# Patient Record
Sex: Female | Born: 1995 | Hispanic: Yes | Marital: Single | Smoking: Never smoker
Health system: Southern US, Community
[De-identification: ages and names within clinical notes are randomized; demographics above are authoritative.]

## PROBLEM LIST (undated history)

## (undated) DIAGNOSIS — Z789 Other specified health status: Secondary | ICD-10-CM

## (undated) HISTORY — DX: Other specified health status: Z78.9

## (undated) HISTORY — PX: NO PAST SURGERIES: SHX2092

---
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
## 2021-12-26 DIAGNOSIS — Z3400 Encounter for supervision of normal first pregnancy, unspecified trimester: Secondary | ICD-10-CM | POA: Insufficient documentation

## 2022-01-03 ENCOUNTER — Other Ambulatory Visit: Payer: Self-pay | Admitting: Obstetrics & Gynecology

## 2022-01-03 DIAGNOSIS — Z3682 Encounter for antenatal screening for nuchal translucency: Secondary | ICD-10-CM

## 2022-01-04 ENCOUNTER — Other Ambulatory Visit: Payer: Medicaid Other

## 2022-01-04 ENCOUNTER — Ambulatory Visit (INDEPENDENT_AMBULATORY_CARE_PROVIDER_SITE_OTHER): Payer: Medicaid Other

## 2022-01-04 ENCOUNTER — Other Ambulatory Visit: Payer: Self-pay | Admitting: Obstetrics & Gynecology

## 2022-01-04 ENCOUNTER — Encounter: Payer: Self-pay | Admitting: Obstetrics & Gynecology

## 2022-01-04 DIAGNOSIS — O3680X0 Pregnancy with inconclusive fetal viability, not applicable or unspecified: Secondary | ICD-10-CM | POA: Diagnosis not present

## 2022-01-04 DIAGNOSIS — Z3682 Encounter for antenatal screening for nuchal translucency: Secondary | ICD-10-CM

## 2022-01-04 NOTE — Progress Notes (Signed)
[place] 10 wks,single IUP,FHR 166 bpm,CRL 30.62 mm,normal ovaries

## 2022-01-05 ENCOUNTER — Ambulatory Visit: Payer: Medicaid Other | Admitting: *Deleted

## 2022-01-05 ENCOUNTER — Encounter: Payer: Self-pay | Admitting: Obstetrics & Gynecology

## 2022-01-05 DIAGNOSIS — Z3401 Encounter for supervision of normal first pregnancy, first trimester: Secondary | ICD-10-CM

## 2022-01-16 ENCOUNTER — Telehealth: Payer: Self-pay | Admitting: Obstetrics & Gynecology

## 2022-01-16 NOTE — Telephone Encounter (Signed)
Called patient and heard message that she has a vm that has not been set up.

## 2022-01-16 NOTE — Telephone Encounter (Signed)
Pt wants advice on taking tylenol.

## 2022-01-25 ENCOUNTER — Other Ambulatory Visit: Payer: Medicaid Other

## 2022-01-26 ENCOUNTER — Other Ambulatory Visit: Payer: Self-pay | Admitting: Obstetrics & Gynecology

## 2022-01-26 DIAGNOSIS — Z3682 Encounter for antenatal screening for nuchal translucency: Secondary | ICD-10-CM

## 2022-01-27 ENCOUNTER — Ambulatory Visit: Payer: Medicaid Other | Admitting: *Deleted

## 2022-01-27 ENCOUNTER — Ambulatory Visit (INDEPENDENT_AMBULATORY_CARE_PROVIDER_SITE_OTHER): Payer: Medicaid Other | Admitting: Obstetrics & Gynecology

## 2022-01-27 ENCOUNTER — Encounter: Payer: Self-pay | Admitting: Obstetrics & Gynecology

## 2022-01-27 ENCOUNTER — Other Ambulatory Visit (HOSPITAL_COMMUNITY)
Admission: RE | Admit: 2022-01-27 | Discharge: 2022-01-27 | Disposition: A | Discharge status: Home / Self Care | Payer: Medicaid Other | Source: Ambulatory Visit | Attending: Obstetrics & Gynecology | Admitting: Obstetrics & Gynecology

## 2022-01-27 ENCOUNTER — Ambulatory Visit (INDEPENDENT_AMBULATORY_CARE_PROVIDER_SITE_OTHER): Payer: Medicaid Other

## 2022-01-27 VITALS — BP 106/68 | HR 70 | Wt 180.0 lb

## 2022-01-27 DIAGNOSIS — Z3401 Encounter for supervision of normal first pregnancy, first trimester: Secondary | ICD-10-CM | POA: Diagnosis not present

## 2022-01-27 DIAGNOSIS — Z124 Encounter for screening for malignant neoplasm of cervix: Secondary | ICD-10-CM

## 2022-01-27 DIAGNOSIS — Z3A13 13 weeks gestation of pregnancy: Secondary | ICD-10-CM | POA: Diagnosis not present

## 2022-01-27 DIAGNOSIS — Z3682 Encounter for antenatal screening for nuchal translucency: Secondary | ICD-10-CM

## 2022-01-27 DIAGNOSIS — Z6831 Body mass index (BMI) 31.0-31.9, adult: Secondary | ICD-10-CM

## 2022-01-27 DIAGNOSIS — O219 Vomiting of pregnancy, unspecified: Secondary | ICD-10-CM | POA: Diagnosis not present

## 2022-01-27 LAB — POCT URINALYSIS DIPSTICK OB
Blood, UA: NEGATIVE
Glucose, UA: NEGATIVE
Leukocytes, UA: NEGATIVE
Nitrite, UA: NEGATIVE

## 2022-01-27 MED ORDER — DOXYLAMINE-PYRIDOXINE 10-10 MG PO TBEC: 2.0000 | DELAYED_RELEASE_TABLET | Freq: Every evening | ORAL | 2 refills | Status: AC

## 2022-01-27 MED ORDER — ASPIRIN 81 MG PO TBEC: 81.0000 mg | DELAYED_RELEASE_TABLET | Freq: Every day | ORAL | 3 refills | Status: AC

## 2022-01-27 MED ORDER — BLOOD PRESSURE MONITOR MISC: 0 refills | Status: DC

## 2022-01-27 NOTE — Progress Notes (Signed)
INITIAL OBSTETRICAL VISIT Patient name: Kelsey Cross MRN [id number]  Date of birth: 1995-07-20 Chief Complaint:   Initial Prenatal Visit (nausea)  History of Present Illness:   Kelsey Cross is a 26 y.o. G1P0 Hispanic female at [redacted]w[redacted]d by [place] at 10 weeks with an Estimated Date of Delivery: 08/02/22 being seen today for her initial obstetrical visit.   Her obstetrical history is significant for  uncomplicated, nullip .   Today she reports nausea.     01/27/2022   11:44 AM 04/03/2016   12:25 PM 04/03/2016   12:19 PM  Depression screen PHQ 2/9  Decreased Interest 0 0 0  Down, Depressed, Hopeless 0 0 0  PHQ - 2 Score 0 0 0  Altered sleeping 0 0   Tired, decreased energy 1 0   Change in appetite 1 0   Feeling bad or failure about yourself  0 0   Trouble concentrating 0 0   Moving slowly or fidgety/restless 0 0   Suicidal thoughts 0 0   PHQ-9 Score 2 0     Patient's last menstrual period was 10/01/2021. Last pap not sure outside facility. Results were: negative per pt report at [facility] Review of Systems:   Pertinent items are noted in HPI Denies cramping/contractions, leakage of fluid, vaginal bleeding, abnormal vaginal discharge w/ itching/odor/irritation, headaches, visual changes, shortness of breath, chest pain, abdominal pain, severe nausea/vomiting, or problems with urination or bowel movements unless otherwise stated above.  Pertinent History Reviewed:  Reviewed past medical,surgical, social, obstetrical and family history.  Reviewed problem list, medications and allergies. OB History  Gravida Para Term Preterm AB Living  1            SAB IAB Ectopic Multiple Live Births               # Outcome Date GA Lbr Len/2nd Weight Sex Delivery Anes PTL Lv  1 Current            Physical Assessment:   Vitals:   01/27/22 1131  BP: 106/68  Pulse: 70  Weight: 180 lb (81.6 kg)  Body mass index is 31.39 kg/m.       Physical Examination:  General appearance - well appearing, and  in no distress  Mental status - alert, oriented to person, place, and time  Psych:  She has a normal mood and affect  Skin - warm and dry, normal color, no suspicious lesions noted  Chest - effort normal, all lung fields clear to auscultation bilaterally  Heart - normal rate and regular rhythm  Abdomen - soft, nontender  Extremities:  No swelling or varicosities noted  Pelvic - VULVA: normal appearing vulva with no masses, tenderness or lesions  VAGINA: normal appearing vagina with normal color and discharge, no lesions  CERVIX: normal appearing cervix without discharge or lesions, no CMT  Thin prep pap is done with HR HPV cotesting  Chaperone: Latisha Cresenzo    TODAY'S NT - [place] 13+2 wks,measurements c/w dates,CRL 70.45 mm,NT 1.6 mm,NB present,FHR 166 bpm,posterior placenta,normal ovaries   Results for orders placed or performed in visit on 01/27/22 (from the past 24 hour(s))  POC Urinalysis Dipstick OB   Collection Time: 01/27/22 11:53 AM  Result Value Ref Range   Color, UA     Clarity, UA     Glucose, UA Negative Negative   Bilirubin, UA     Ketones, UA large    Spec Grav, UA     Blood, UA neg  pH, UA     POC,PROTEIN,UA Small (1+) Negative, Trace, Small (1+), Moderate (2+), Large (3+), 4+   Urobilinogen, UA     Nitrite, UA neg    Leukocytes, UA Negative Negative   Appearance     Odor      Assessment & Plan:  1) Low-Risk Pregnancy G1P0 at [redacted]w[redacted]d with an Estimated Date of Delivery: 08/02/22   2) Initial OB visit Start ASA daily  3) Nausea- Rx for Diclegis sent in   Meds:  Meds ordered this encounter  Medications   Blood Pressure Monitor MISC    Sig: For regular home bp monitoring during pregnancy    Dispense:  1 each    Refill:  0    Z34.81 Please mail to patient   Doxylamine-Pyridoxine (DICLEGIS) 10-10 MG TBEC    Sig: Take 2 tablets by mouth at bedtime.    Dispense:  60 tablet    Refill:  2   aspirin EC 81 MG tablet    Sig: Take 1 tablet (81 mg total) by  mouth daily. Swallow whole.    Dispense:  90 tablet    Refill:  3    Initial labs obtained Continue prenatal vitamins Reviewed n/v relief measures and warning s/s to report Reviewed recommended weight gain based on pre-gravid BMI Encouraged well-balanced diet Genetic & carrier screening discussed: requests Panorama,  Ultrasound discussed; fetal survey: results reviewed CCNC completed> form faxed if has or is planning to apply for medicaid The nature of [place] [facility] [employer] with multiple MDs and other Advanced Practice Providers was explained to patient; also emphasized that fellows, residents, and students are part of our team. She does not have a home bp cuff. Rx prescription given to patient. Check bp weekly, let us know if >140/90.   Indications for ASA therapy (per uptodate) One of the following: H/O preeclampsia, especially early onset/adverse outcome No Multifetal gestation No CHTN No T1DM or T2DM No Chronic kidney disease No Autoimmune disease (antiphospholipid syndrome, systemic lupus erythematosus) No  OR Two or more of the following: Nulliparity Yes Obesity (BMI>30 kg/m2) Yes Family h/o preeclampsia in mother or sister No Age ?35 years No Sociodemographic characteristics (African American race, low socioeconomic level) No Personal risk factors (eg, previous pregnancy w/ LBW or SGA, previous adverse pregnancy outcome [eg, stillbirth], interval >10 years between pregnancies) No  Indications for early A1C (per uptodate) BMI >=25 (>=23 in Asian women) AND one of the following GDM in a previous pregnancy No Previous A1C?5.7, impaired glucose tolerance, or impaired fasting glucose on previous testing No First-degree relative with diabetes No High-risk race/ethnicity (eg, African American, Latino, Native American, Asian American, Pacific Islander) Yes- Hispanic History of cardiovascular disease No HTN or on therapy for hypertension No HDL cholesterol  level <35 mg/dL (3.15 mmol/L) and/or a triglyceride level >250 mg/dL (1.76 mmol/L) No PCOS No Physical inactivity Yes Other clinical condition associated with insulin resistance (eg, severe obesity, acanthosis nigricans) No Previous birth of an infant weighing ?4000 g No Previous stillbirth of unknown cause No >= 40yo No  Follow-up: Return in about 4 weeks (around 02/24/2022) for LROB visit.   Orders Placed This Encounter  Procedures   Urine Culture   GC/Chlamydia Probe Amp   Integrated 1   HORIZON CUSTOM   CBC/D/Plt+RPR+Rh+ABO+RubIgG...   Hemoglobin A1c   Panorama Prenatal Test Full Panel   POC Urinalysis Dipstick OB   Myna Hidalgo, DO Attending Obstetrician & Gynecologist, [facility] for [employer], [place]  [facility]

## 2022-01-27 NOTE — Progress Notes (Signed)
[place] 13+2 wks,measurements c/w dates,CRL 70.45 mm,NT 1.6 mm,NB present,FHR 166 bpm,posterior placenta,normal ovaries

## 2022-01-29 LAB — URINE CULTURE

## 2022-01-31 ENCOUNTER — Telehealth: Payer: Self-pay | Admitting: Obstetrics & Gynecology

## 2022-01-31 LAB — INTEGRATED 1
Crown Rump Length: 70.5 mm
Gest. Age on Collection Date: 13 weeks
Maternal Age at EDD: 24.5 yr
Nuchal Translucency (NT): 1.6 mm
Number of Fetuses: 1
PAPP-A Value: 238 ng/mL
Weight: 180 [lb_av]

## 2022-01-31 LAB — CBC/D/PLT+RPR+RH+ABO+RUBIGG...
Antibody Screen: NEGATIVE
Basophils Absolute: 0 10*3/uL (ref 0.0–0.2)
Basos: 0 %
EOS (ABSOLUTE): 0 10*3/uL (ref 0.0–0.4)
Eos: 0 %
HCV Ab: NONREACTIVE
HIV Screen 4th Generation wRfx: NONREACTIVE
Hematocrit: 41.4 % (ref 34.0–46.6)
Hemoglobin: 13.6 g/dL (ref 11.1–15.9)
Hepatitis B Surface Ag: NEGATIVE
Immature Grans (Abs): 0 10*3/uL (ref 0.0–0.1)
Immature Granulocytes: 0 %
Lymphocytes Absolute: 1.7 10*3/uL (ref 0.7–3.1)
Lymphs: 24 %
MCH: 27.5 pg (ref 26.6–33.0)
MCHC: 32.9 g/dL (ref 31.5–35.7)
MCV: 84 fL (ref 79–97)
Monocytes Absolute: 0.4 10*3/uL (ref 0.1–0.9)
Monocytes: 6 %
Neutrophils Absolute: 5 10*3/uL (ref 1.4–7.0)
Neutrophils: 70 %
Platelets: 366 10*3/uL (ref 150–450)
RBC: 4.94 x10E6/uL (ref 3.77–5.28)
RDW: 13.1 % (ref 11.7–15.4)
RPR Ser Ql: NONREACTIVE
Rh Factor: POSITIVE
Rubella Antibodies, IGG: 0.95 index — ABNORMAL LOW (ref >0.99)
WBC: 7.2 10*3/uL (ref 3.4–10.8)

## 2022-01-31 LAB — HEMOGLOBIN A1C
Est. average glucose Bld gHb Est-mCnc: 108 mg/dL
Hgb A1c MFr Bld: 5.4 % (ref 4.8–5.6)

## 2022-01-31 LAB — HCV INTERPRETATION

## 2022-02-01 ENCOUNTER — Other Ambulatory Visit: Payer: Self-pay | Admitting: Obstetrics & Gynecology

## 2022-02-01 ENCOUNTER — Encounter: Payer: Self-pay | Admitting: *Deleted

## 2022-02-01 DIAGNOSIS — A749 Chlamydial infection, unspecified: Secondary | ICD-10-CM

## 2022-02-01 LAB — CYTOLOGY - PAP
Comment: NEGATIVE
Diagnosis: UNDETERMINED — AB
High risk HPV: NEGATIVE

## 2022-02-01 LAB — GC/CHLAMYDIA PROBE AMP
Chlamydia trachomatis, NAA: POSITIVE — AB
Neisseria Gonorrhoeae by PCR: NEGATIVE

## 2022-02-01 MED ORDER — AZITHROMYCIN 500 MG PO TABS: 1000.0000 mg | ORAL_TABLET | Freq: Every day | ORAL | 1 refills | Status: AC

## 2022-02-01 NOTE — Progress Notes (Signed)
Rx for Chlamydia sent in along with refill for partner if needed  Myna Hidalgo, DO Attending Obstetrician & Gynecologist, [facility] for [employer], [facility]

## 2022-02-04 LAB — PANORAMA PRENATAL TEST FULL PANEL:PANORAMA TEST PLUS 5 ADDITIONAL MICRODELETIONS
FETAL FRACTION: 2.2
REPORT SUMMARY: HIGH — AB
TRIPLOIDY 13 18 RESULT TEXT: HIGH — AB

## 2022-02-06 NOTE — Telephone Encounter (Signed)
Na

## 2022-02-14 LAB — HORIZON CUSTOM: REPORT SUMMARY: NEGATIVE

## 2022-02-24 ENCOUNTER — Encounter: Payer: Self-pay | Admitting: Obstetrics & Gynecology

## 2022-02-24 ENCOUNTER — Other Ambulatory Visit (HOSPITAL_COMMUNITY)
Admission: RE | Admit: 2022-02-24 | Discharge: 2022-02-24 | Disposition: A | Discharge status: Home / Self Care | Payer: Medicaid Other | Source: Ambulatory Visit | Attending: Obstetrics & Gynecology | Admitting: Obstetrics & Gynecology

## 2022-02-24 ENCOUNTER — Ambulatory Visit (INDEPENDENT_AMBULATORY_CARE_PROVIDER_SITE_OTHER): Payer: Medicaid Other | Admitting: Obstetrics & Gynecology

## 2022-02-24 VITALS — BP 131/73 | HR 77 | Wt 192.0 lb

## 2022-02-24 DIAGNOSIS — Z3402 Encounter for supervision of normal first pregnancy, second trimester: Secondary | ICD-10-CM

## 2022-02-24 DIAGNOSIS — N898 Other specified noninflammatory disorders of vagina: Secondary | ICD-10-CM | POA: Diagnosis present

## 2022-02-24 DIAGNOSIS — Z8619 Personal history of other infectious and parasitic diseases: Secondary | ICD-10-CM

## 2022-02-24 DIAGNOSIS — R3989 Other symptoms and signs involving the genitourinary system: Secondary | ICD-10-CM

## 2022-02-24 MED ORDER — AZITHROMYCIN 500 MG PO TABS: 1000.0000 mg | ORAL_TABLET | Freq: Once | ORAL | 1 refills | Status: AC

## 2022-02-24 NOTE — Progress Notes (Signed)
LOW-RISK PREGNANCY VISIT Patient name: Kelsey Cross MRN [id number]  Date of birth: [birth year] Chief Complaint:   Routine Prenatal Visit  History of Present Illness:   Kelsey Cross is a 26 y.o. G1P0 female at [redacted]w[redacted]d with an Estimated Date of Delivery: 08/02/22 being seen today for ongoing management of a low-risk pregnancy.   Notes discharge and irritation- no itching, no odor for the past 3-4 days.  Bought Monistat 7, tried it x 1.  No pelvic or abdominal pain.  Notes some discomfort with urination and some urinary frequency.  No hematuria.     01/27/2022   11:44 AM 04/03/2016   12:25 PM 04/03/2016   12:19 PM  Depression screen PHQ 2/9  Decreased Interest 0 0 0  Down, Depressed, Hopeless 0 0 0  PHQ - 2 Score 0 0 0  Altered sleeping 0 0   Tired, decreased energy 1 0   Change in appetite 1 0   Feeling bad or failure about yourself  0 0   Trouble concentrating 0 0   Moving slowly or fidgety/restless 0 0   Suicidal thoughts 0 0   PHQ-9 Score 2 0      Contractions: Not present. Vag. Bleeding: None.  Movement: Absent. denies leaking of fluid. Review of Systems:   Pertinent items are noted in HPI Denies headaches, visual changes, shortness of breath, chest pain, abdominal pain, severe nausea/vomiting, or problems with urination or bowel movements unless otherwise stated above. Pertinent History Reviewed:  Reviewed past medical,surgical, social, obstetrical and family history.  Reviewed problem list, medications and allergies.  Physical Assessment:   Vitals:   02/24/22 0836  BP: 131/73  Pulse: 77  Weight: 192 lb (87.1 kg)  Body mass index is 33.48 kg/m.        Physical Examination:   General appearance: Well appearing, and in no distress  Mental status: Alert, oriented to person, place, and time  Skin: Warm & dry  Respiratory: Normal respiratory effort, no distress  Abdomen: Soft, gravid, nontender  Pelvic: No external lesions, white clumpy discharge noted, cervix  appears closed- no lesions or discharge       Extremities: Edema: None  Psych:  mood and affect appropriate  Fetal Status: Fetal Heart Rate (bpm): 150   Movement: Absent    Chaperone: Faith Rogue    No results found for this or any previous visit (from the past 24 hour(s)).   Assessment & Plan:  1) Low-risk pregnancy G1P0 at [redacted]w[redacted]d with an Estimated Date of Delivery: 08/02/22   2) Vaginitis with h/o Chlamydia -TOC today though it sounds as though partner was never treated -Discussed treatment of both her and partner -will also r/o yeast and/or UTI    3) Genetic testing -due for IT2 -low FF for panorama, plan to repeat today  Meds: No orders of the defined types were placed in this encounter.  Labs/procedures today: UA and culture, vaginitis panel  Plan:  Continue routine obstetrical care  Next visit: prefers in person    Reviewed: Preterm labor symptoms and general obstetric precautions including but not limited to vaginal bleeding, contractions, leaking of fluid and fetal movement were reviewed in detail with the patient.  All questions were answered.  Follow-up: Return in about 4 weeks (around 03/24/2022) for LROB visit and anatomy scan.  Orders Placed This Encounter  Procedures   Panorama Prenatal Test Full Panel   INTEGRATED 2    Myna Hidalgo, DO Attending Obstetrician & Gynecologist, [facility] for [facility]  [facility]care, [facility]

## 2022-02-25 LAB — URINALYSIS, ROUTINE W REFLEX MICROSCOPIC
Bilirubin, UA: NEGATIVE
Glucose, UA: NEGATIVE
Ketones, UA: NEGATIVE
Nitrite, UA: NEGATIVE
Protein,UA: NEGATIVE
RBC, UA: NEGATIVE
Specific Gravity, UA: 1.01 (ref 1.005–1.030)
Urobilinogen, Ur: 0.2 mg/dL (ref 0.2–1.0)
pH, UA: 8 — ABNORMAL HIGH (ref 5.0–7.5)

## 2022-02-25 LAB — MICROSCOPIC EXAMINATION
Casts: NONE SEEN /lpf
RBC, Urine: NONE SEEN /hpf (ref 0–2)

## 2022-02-27 ENCOUNTER — Other Ambulatory Visit: Payer: Self-pay | Admitting: Obstetrics & Gynecology

## 2022-02-27 DIAGNOSIS — A599 Trichomoniasis, unspecified: Secondary | ICD-10-CM

## 2022-02-27 LAB — CERVICOVAGINAL ANCILLARY ONLY
Bacterial Vaginitis (gardnerella): NEGATIVE
Candida Glabrata: NEGATIVE
Candida Vaginitis: POSITIVE — AB
Chlamydia: NEGATIVE
Comment: NEGATIVE
Comment: NEGATIVE
Comment: NEGATIVE
Comment: NEGATIVE
Comment: NEGATIVE
Comment: NORMAL
Neisseria Gonorrhea: NEGATIVE
Trichomonas: POSITIVE — AB

## 2022-02-27 LAB — URINE CULTURE

## 2022-02-27 MED ORDER — METRONIDAZOLE 500 MG PO TABS: 500.0000 mg | ORAL_TABLET | Freq: Two times a day (BID) | ORAL | 1 refills | Status: AC

## 2022-02-27 NOTE — Progress Notes (Signed)
Rx for Flagyl due to Trich/BV  Myna Hidalgo, DO Attending Obstetrician & Gynecologist, [facility] for [facility], [facility]

## 2022-03-02 LAB — PANORAMA PRENATAL TEST FULL PANEL:PANORAMA TEST PLUS 5 ADDITIONAL MICRODELETIONS: FETAL FRACTION: 2.4

## 2022-03-24 ENCOUNTER — Other Ambulatory Visit: Payer: Self-pay | Admitting: Obstetrics & Gynecology

## 2022-03-24 DIAGNOSIS — Z363 Encounter for antenatal screening for malformations: Secondary | ICD-10-CM

## 2022-03-27 ENCOUNTER — Ambulatory Visit (INDEPENDENT_AMBULATORY_CARE_PROVIDER_SITE_OTHER): Payer: Medicaid Other

## 2022-03-27 ENCOUNTER — Encounter: Payer: Self-pay | Admitting: Advanced Practice Midwife

## 2022-03-27 ENCOUNTER — Other Ambulatory Visit (HOSPITAL_COMMUNITY)
Admission: RE | Admit: 2022-03-27 | Discharge: 2022-03-27 | Disposition: A | Discharge status: Home / Self Care | Payer: Medicaid Other | Source: Ambulatory Visit | Attending: Women's Health | Admitting: Women's Health

## 2022-03-27 ENCOUNTER — Ambulatory Visit (INDEPENDENT_AMBULATORY_CARE_PROVIDER_SITE_OTHER): Payer: Medicaid Other | Admitting: Advanced Practice Midwife

## 2022-03-27 VITALS — BP 114/71 | HR 75 | Wt 201.4 lb

## 2022-03-27 DIAGNOSIS — Z363 Encounter for antenatal screening for malformations: Secondary | ICD-10-CM

## 2022-03-27 DIAGNOSIS — Z3A21 21 weeks gestation of pregnancy: Secondary | ICD-10-CM

## 2022-03-27 DIAGNOSIS — Z09 Encounter for follow-up examination after completed treatment for conditions other than malignant neoplasm: Secondary | ICD-10-CM | POA: Insufficient documentation

## 2022-03-27 DIAGNOSIS — Z3402 Encounter for supervision of normal first pregnancy, second trimester: Secondary | ICD-10-CM

## 2022-03-27 NOTE — Progress Notes (Signed)
   LOW-RISK PREGNANCY VISIT Patient name: Kelsey Cross MRN [id number]  Date of birth: [birth year] Chief Complaint:   Routine Prenatal Visit and Pregnancy Ultrasound (Proof of cure)  History of Present Illness:   Kelsey Cross is a 26 y.o. G1P0 female at [redacted]w[redacted]d with an Estimated Date of Delivery: 08/02/22 being seen today for ongoing management of a low-risk pregnancy. She has had 2 cfDNA which resulted in low FF (1st one claimed ^ risk for T13/18 d/t low FF).  Because today's [place] is normal, will draw 2nd It and go from there (consulted w/Dr.Duncan).  Today she reports no complaints. Contractions: Not present.  .  Movement: Present. denies leaking of fluid. Review of Systems:   Pertinent items are noted in HPI Denies abnormal vaginal discharge w/ itching/odor/irritation, headaches, visual changes, shortness of breath, chest pain, abdominal pain, severe nausea/vomiting, or problems with urination or bowel movements unless otherwise stated above. Pertinent History Reviewed:  Reviewed past medical,surgical, social, obstetrical and family history.  Reviewed problem list, medications and allergies. Physical Assessment:   Vitals:   03/27/22 1559  BP: 114/71  Pulse: 75  Weight: 201 lb 6.4 oz (91.4 kg)  Body mass index is 35.12 kg/m.        Physical Examination:   General appearance: Well appearing, and in no distress  Mental status: Alert, oriented to person, place, and time  Skin: Warm & dry  Cardiovascular: Normal heart rate noted  Respiratory: Normal respiratory effort, no distress  Abdomen: Soft, gravid, nontender  Pelvic: Cervical exam deferred         Extremities: Edema: None  Fetal Status: Fetal Heart Rate (bpm): [place]   Movement: Present  [place] 21+5 wks,breech,posterior placenta gr 0,FHR 159 bpm,SVP of fluid 5 cm,CX 4.7 cm,EFW 440 g 40%,anatomy complete,no obvious abnormalities,limited view   Chaperone: n/a    No results found for this or any previous visit (from the past 24 hour(s)).   Assessment & Plan:  1) Low-risk pregnancy G1P0 at [redacted]w[redacted]d with an Estimated Date of Delivery: 08/02/22   2) inconclusive cfDNA, complete NT/IT    Meds: No orders of the defined types were placed in this encounter.  Labs/procedures today: anatomy scan, 2nd IT  Plan:  Continue routine obstetrical care  Next visit: prefers in person    Reviewed: Preterm labor symptoms and general obstetric precautions including but not limited to vaginal bleeding, contractions, leaking of fluid and fetal movement were reviewed in detail with the patient.  All questions were answered. Has home bp cuff.. Check bp weekly, let us know if >140/90.   Follow-up: Return in about 4 weeks (around 04/24/2022) for [place].  No future appointments.   No orders of the defined types were placed in this encounter.  Christin Fudge DNP, CNM 03/27/2022 4:25 PM

## 2022-03-27 NOTE — Patient Instructions (Signed)
Kelsey Cross, I greatly value your feedback.  If you receive a survey following your visit with [place] today, we appreciate you taking the time to fill it out.  Thanks, Nigel Berthold, CNM     [place]!!! It is now [place] at [facility] ([place], [place] [phone]) Entrance located off of [address] parking   Go to [email or web address] to register for FREE online childbirth classes    Second Trimester of Pregnancy The second trimester is from week 14 through week 27 (months 4 through 6). The second trimester is often a time when you feel your best. Your body has adjusted to being pregnant, and you begin to feel better physically. Usually, morning sickness has lessened or quit completely, you may have more energy, and you may have an increase in appetite. The second trimester is also a time when the fetus is growing rapidly. At the end of the sixth month, the fetus is about 9 inches long and weighs about 1 pounds. You will likely begin to feel the baby move (quickening) between 16 and 20 weeks of pregnancy. Body changes during your second trimester Your body continues to go through many changes during your second trimester. The changes vary from woman to woman. Your weight will continue to increase. You will notice your lower abdomen bulging out. You may begin to get stretch marks on your hips, abdomen, and breasts. You may develop headaches that can be relieved by medicines. The medicines should be approved by your health care provider. You may urinate more often because the fetus is pressing on your bladder. You may develop or continue to have heartburn as a result of your pregnancy. You may develop constipation because certain hormones are causing the muscles that push waste through your intestines to slow down. You may develop hemorrhoids or swollen, bulging veins (varicose veins). You may have back pain. This is  caused by: Weight gain. Pregnancy hormones that are relaxing the joints in your pelvis. A shift in weight and the muscles that support your balance. Your breasts will continue to grow and they will continue to become tender. Your gums may bleed and may be sensitive to brushing and flossing. Dark spots or blotches (chloasma, mask of pregnancy) may develop on your face. This will likely fade after the baby is born. A dark line from your belly button to the pubic area (linea nigra) may appear. This will likely fade after the baby is born. You may have changes in your hair. These can include thickening of your hair, rapid growth, and changes in texture. Some women also have hair loss during or after pregnancy, or hair that feels dry or thin. Your hair will most likely return to normal after your baby is born.  What to expect at prenatal visits During a routine prenatal visit: You will be weighed to make sure you and the fetus are growing normally. Your blood pressure will be taken. Your abdomen will be measured to track your baby's growth. The fetal heartbeat will be listened to. Any test results from the previous visit will be discussed.  Your health care provider may ask you: How you are feeling. If you are feeling the baby move. If you have had any abnormal symptoms, such as leaking fluid, bleeding, severe headaches, or abdominal cramping. If you are using any tobacco products, including cigarettes, chewing tobacco, and electronic cigarettes. If you have any questions.  Other tests that  may be performed during your second trimester include: Blood tests that check for: Low iron levels (anemia). High blood sugar that affects pregnant women (gestational diabetes) between 66 and 28 weeks. Rh antibodies. This is to check for a protein on red blood cells (Rh factor). Urine tests to check for infections, diabetes, or protein in the urine. An ultrasound to confirm the proper growth and  development of the baby. An amniocentesis to check for possible genetic problems. Fetal screens for spina bifida and Down syndrome. HIV (human immunodeficiency virus) testing. Routine prenatal testing includes screening for HIV, unless you choose not to have this test.  Follow these instructions at home: Medicines Follow your health care provider's instructions regarding medicine use. Specific medicines may be either safe or unsafe to take during pregnancy. Take a prenatal vitamin that contains at least 600 micrograms (mcg) of folic acid. If you develop constipation, try taking a stool softener if your health care provider approves. Eating and drinking Eat a balanced diet that includes fresh fruits and vegetables, whole grains, good sources of protein such as meat, eggs, or tofu, and low-fat dairy. Your health care provider will help you determine the amount of weight gain that is right for you. Avoid raw meat and uncooked cheese. These carry germs that can cause birth defects in the baby. If you have low calcium intake from food, talk to your health care provider about whether you should take a daily calcium supplement. Limit foods that are high in fat and processed sugars, such as fried and sweet foods. To prevent constipation: Drink enough fluid to keep your urine clear or pale yellow. Eat foods that are high in fiber, such as fresh fruits and vegetables, whole grains, and beans. Activity Exercise only as directed by your health care provider. Most women can continue their usual exercise routine during pregnancy. Try to exercise for 30 minutes at least 5 days a week. Stop exercising if you experience uterine contractions. Avoid heavy lifting, wear low heel shoes, and practice good posture. A sexual relationship may be continued unless your health care provider directs you otherwise. Relieving pain and discomfort Wear a good support bra to prevent discomfort from breast tenderness. Take  warm sitz baths to soothe any pain or discomfort caused by hemorrhoids. Use hemorrhoid cream if your health care provider approves. Rest with your legs elevated if you have leg cramps or low back pain. If you develop varicose veins, wear support hose. Elevate your feet for 15 minutes, 3-4 times a day. Limit salt in your diet. Prenatal Care Write down your questions. Take them to your prenatal visits. Keep all your prenatal visits as told by your health care provider. This is important. Safety Wear your seat belt at all times when driving. Make a list of emergency phone numbers, including numbers for family, friends, the hospital, and police and fire departments. General instructions Ask your health care provider for a referral to a local prenatal education class. Begin classes no later than the beginning of month 6 of your pregnancy. Ask for help if you have counseling or nutritional needs during pregnancy. Your health care provider can offer advice or refer you to specialists for help with various needs. Do not use hot tubs, steam rooms, or saunas. Do not douche or use tampons or scented sanitary pads. Do not cross your legs for long periods of time. Avoid cat litter boxes and soil used by cats. These carry germs that can cause birth defects in the baby and  possibly loss of the fetus by miscarriage or stillbirth. Avoid all smoking, herbs, alcohol, and unprescribed drugs. Chemicals in these products can affect the formation and growth of the baby. Do not use any products that contain nicotine or tobacco, such as cigarettes and e-cigarettes. If you need help quitting, ask your health care provider. Visit your dentist if you have not gone yet during your pregnancy. Use a soft toothbrush to brush your teeth and be gentle when you floss. Contact a health care provider if: You have dizziness. You have mild pelvic cramps, pelvic pressure, or nagging pain in the abdominal area. You have persistent  nausea, vomiting, or diarrhea. You have a bad smelling vaginal discharge. You have pain when you urinate. Get help right away if: You have a fever. You are leaking fluid from your vagina. You have spotting or bleeding from your vagina. You have severe abdominal cramping or pain. You have rapid weight gain or weight loss. You have shortness of breath with chest pain. You notice sudden or extreme swelling of your face, hands, ankles, feet, or legs. You have not felt your baby move in over an hour. You have severe headaches that do not go away when you take medicine. You have vision changes. Summary The second trimester is from week 14 through week 27 (months 4 through 6). It is also a time when the fetus is growing rapidly. Your body goes through many changes during pregnancy. The changes vary from woman to woman. Avoid all smoking, herbs, alcohol, and unprescribed drugs. These chemicals affect the formation and growth your baby. Do not use any tobacco products, such as cigarettes, chewing tobacco, and e-cigarettes. If you need help quitting, ask your health care provider. Contact your health care provider if you have any questions. Keep all prenatal visits as told by your health care provider. This is important. This information is not intended to replace advice given to you by your health care provider. Make sure you discuss any questions you have with your health care provider.

## 2022-03-27 NOTE — Progress Notes (Signed)
[place] 21+5 wks,breech,posterior placenta gr 0,FHR 159 bpm,SVP of fluid 5 cm,CX 4.7 cm,EFW 440 g 40%,anatomy complete,no obvious abnormalities,limited view

## 2022-03-29 ENCOUNTER — Other Ambulatory Visit: Payer: Self-pay | Admitting: Obstetrics & Gynecology

## 2022-03-29 DIAGNOSIS — O285 Abnormal chromosomal and genetic finding on antenatal screening of mother: Secondary | ICD-10-CM

## 2022-03-29 LAB — INTEGRATED 2
AFP MoM: 1.93
Alpha-Fetoprotein: 111 ng/mL
Crown Rump Length: 70.5 mm
DIA MoM: 0.81
DIA Value: 161.1 pg/mL
Estriol, Unconjugated: 1.48 ng/mL
Gest. Age on Collection Date: 13 weeks
Gestational Age: 21.4 weeks
Maternal Age at EDD: 24.5 yr
Nuchal Translucency (NT): 1.6 mm
Nuchal Translucency MoM: 1
Number of Fetuses: 1
PAPP-A MoM: 0.24
PAPP-A Value: 238 ng/mL
Test Results:: POSITIVE — AB
Weight: 180 [lb_av]
Weight: 180 [lb_av]
hCG MoM: 0.61
hCG Value: 12 IU/mL
uE3 MoM: 0.53

## 2022-03-29 LAB — CERVICOVAGINAL ANCILLARY ONLY
Chlamydia: NEGATIVE
Comment: NEGATIVE
Comment: NEGATIVE
Comment: NORMAL
Neisseria Gonorrhea: NEGATIVE
Trichomonas: NEGATIVE

## 2022-03-29 NOTE — Progress Notes (Signed)
Called patient to review results of recent IT-2 testing.  Discussed findings and next step which include MFM referral with follow up US and possibel further testing.  Janyth Pupa, DO Attending [place], [facility] for [employer], [place]

## 2022-03-29 NOTE — Progress Notes (Signed)
Order for US 

## 2022-03-30 ENCOUNTER — Encounter: Payer: Self-pay | Admitting: Advanced Practice Midwife

## 2022-03-30 DIAGNOSIS — O285 Abnormal chromosomal and genetic finding on antenatal screening of mother: Secondary | ICD-10-CM | POA: Insufficient documentation

## 2022-04-12 ENCOUNTER — Ambulatory Visit (HOSPITAL_BASED_OUTPATIENT_CLINIC_OR_DEPARTMENT_OTHER): Payer: Medicaid Other

## 2022-04-12 ENCOUNTER — Ambulatory Visit: Payer: Medicaid Other | Admitting: *Deleted

## 2022-04-12 ENCOUNTER — Ambulatory Visit: Payer: Medicaid Other | Attending: Obstetrics & Gynecology

## 2022-04-12 ENCOUNTER — Ambulatory Visit: Payer: Medicaid Other

## 2022-04-12 VITALS — BP 121/71 | HR 83

## 2022-04-12 DIAGNOSIS — O285 Abnormal chromosomal and genetic finding on antenatal screening of mother: Secondary | ICD-10-CM

## 2022-04-12 DIAGNOSIS — O281 Abnormal biochemical finding on antenatal screening of mother: Secondary | ICD-10-CM

## 2022-04-12 DIAGNOSIS — O99212 Obesity complicating pregnancy, second trimester: Secondary | ICD-10-CM

## 2022-04-12 DIAGNOSIS — Z3A24 24 weeks gestation of pregnancy: Secondary | ICD-10-CM

## 2022-04-12 DIAGNOSIS — E669 Obesity, unspecified: Secondary | ICD-10-CM

## 2022-04-12 NOTE — Progress Notes (Signed)
[facility] at [facility] for [place], [address] Phone:  [phone]   Fax:  [phone]    Name: Kelsey Cross Indication: Discuss risk for aneuploidy in the current pregnancy  DOB: December 25, 1995 Age: 26 y.o.   EDC: 08/02/2022 LMP: 10/01/2021 Referring Provider:  Chevis Pretty, *  EGA: 100w0d Genetic Counselor: Staci Righter, MS, CGC  OB Hx: G1P0 Date of Appointment: 04/12/2022  Accompanied by: Her partner, "Alex" Face to Face Time: 40 Minutes   Previous Testing Completed: Kelsey Cross previously completed carrier screening. She screened to not be a carrier for Cystic Fibrosis (CF), Spinal Muscular Atrophy (SMA), alpha thalassemia, and beta hemoglobinopathies. A negative result on carrier screening reduces the likelihood of being a carrier, however, does not entirely rule out the possibility.   Medical & Family History:  Reports she takes prenatal vitamins and aspirin. Denies personal history of diabetes, high blood pressure, thyroid conditions, and seizures. Denies bleeding and fevers in this pregnancy. Denies using tobacco, alcohol, or street drugs in this pregnancy.  Maternal ethnicity reported as Hispanic and paternal ethnicity reported as Hispanic. Denies consanguinity. Anzlee denied a family history of chromosome conditions, intellectual disability, autism, birth defects, bone/skeletal disorders, blindness, deafness, blood disorders, neuromuscular disorders, still births, early infant deaths, and 3 or more pregnancy losses for one person on her prenatal intake questionnaire.      Genetic Counseling:   Risk for Aneuploidy. Kelsey Cross previously completed several screening tests for aneuploidy in this pregnancy: cfDNA: This analyzes cell-free DNA originating from the placenta that is found in the maternal blood circulation during pregnancy to provide information regarding the presence or absence of extra DNA for chromosomes 13, 18, and 21 as  well as the sex chromosomes. Kelsey Cross completed cfDNA twice in this pregnancy and did not obtain results either time due to a low fetal (placental) fraction (2.2% and 2.4% respectively). A low fetal fraction could be attributed to normal variation, gestational age, maternal weight, and certain medications; also, this finding may be associated with some chromosome abnormalities. One of Kelsey Cross's cfDNA reports quotes a 1 in 17 risk for the current pregnancy to have Trisomy 13, Trisomy 64, or Triploidy. Integrated Screening: This is a less sensitive and less specific screen that is performed in two parts between 10-14 weeks and 15-[redacted] weeks gestation. It utilizes the Nuchal Translucency (NT) measurement as well as measurements of PAPP-A, AFP HCG, DIA, and uE3 in the maternal serum to generate a risk estimate for the pregnancy. [facility]'s integrated screen quotes a 1 in 72 risk for the current pregnancy to have Trisomy 18.  Genetic counseling reviewed these screening tests in detail with Kelsey Cross. We described that there is increased risk for the current pregnancy to be affected with aneuploidy and described the features individuals affected with Trisomy 43, Trisomy 36, or Triploidy may have. We also reviewed the limitations of ultrasound examination. Kelsey Cross was counseled that she has the option of repeating cfDNA a third time given that she is farther along in pregnancy and there might be enough fetal fraction to complete the screening. We shared with Kelsey Cross that a low risk result on cfDNA may be reassuring but would not guarantee a healthy pregnancy and would not rule out aneuploidy. We also discussed the option of amniocentesis for prenatal diagnosis. This procedure involves the removal of a small amount of amniotic fluid from the sac surrounding the pregnancy with the use of a thin needle inserted through the maternal abdomen and uterus. Ultrasound guidance is  used throughout the procedure. Possible procedural  difficulties and complications that can arise include maternal infection, cramping, bleeding, fluid leakage, and/or pregnancy loss. The risk for pregnancy loss with an amniocentesis is 1/500. Kelsey Cross was counseled regarding the various testing options that could be ordered on an amniocentesis sample, including information about what each test is looking for, turnaround time, and the benefits/limitations of each option. Specifically, we discussed the options of FISH, a fetal karyotype, and fetal microarray. Finally, we discussed that a third option available to Kelsey Cross would be to decline further screening/testing and monitor the pregnancy via routine ultrasounds. After hearing the above information, Kelsey Cross decided to attempt cfDNA screening a third time and her blood was drawn today.     Testing/Screening Options:   Amniocentesis for Prenatal Diagnosis. This procedure involves the removal of a small amount of amniotic fluid from the sac surrounding the pregnancy with the use of a thin needle inserted through the maternal abdomen and uterus. Ultrasound guidance is used throughout the procedure. Possible procedural difficulties and complications that can arise include maternal infection, cramping, bleeding, fluid leakage, and/or pregnancy loss. The risk for pregnancy loss with an amniocentesis is 1/500. Per the [employer] (ACOG) Practice Bulletin 162, all pregnant women should be offered prenatal assessment for aneuploidy by diagnostic testing regardless of maternal age or other risk factors. If indicated, genetic testing that could be ordered on an amniocentesis sample includes a karyotype, microarray, and testing for specific syndromes. A karyotype can detect chromosomal aneuploidies as well as large deletions or duplications of chromosomal material and chromosomal rearrangements. Microarray assesses for smaller pieces of chromosomal material that are missing or extra that  fall below the detection range of a karyotype. These chromosomal changes can be associated with various microdeletion or microduplication syndromes that could have impacts on one's health or development. A microarray can also detect some microdeletion and microduplications that have unclear clinical relevance (variants of uncertain significance) and consanguinity.   Cell-Free DNA Screening (cfDNA). This screen analyzes cell-free DNA originating from the placenta that is found in the maternal blood circulation during pregnancy to provide information regarding the presence or absence of extra DNA for chromosomes 13, 18, and 21 as well as the sex chromosomes. If cfDNA results indicate high-risk for a chromosomal aneuploidy, prenatal diagnosis via CVS or amniocentesis would be recommended. A low-risk cfDNA result does not ensure an unaffected pregnancy. Additionally, cfDNA does not screen for neural tube defects or other genetic conditions. Per the ACOG Practice Bulletin 74 all pregnant women regardless of age, baseline risk, and number of fetuses should be offered all screening options including cfDNA which was previously only offered for singleton high risk pregnancies.     Patient Plan:  Proceed with: Third cfDNA attempt drawn today Informed consent was obtained. All questions were answered.  Declined: Amniocentesis   Thank you for sharing in the care of [facility] with [place].  Please do not hesitate to contact us if you have any questions.  Staci Righter, MS, [place] Certified Genetic Counselor  Genetic counseling student involved in appointment: Yes (C.D.)

## 2022-04-13 ENCOUNTER — Other Ambulatory Visit: Payer: Self-pay | Admitting: *Deleted

## 2022-04-13 DIAGNOSIS — Z3689 Encounter for other specified antenatal screening: Secondary | ICD-10-CM

## 2022-04-13 DIAGNOSIS — Z362 Encounter for other antenatal screening follow-up: Secondary | ICD-10-CM

## 2022-04-13 DIAGNOSIS — O280 Abnormal hematological finding on antenatal screening of mother: Secondary | ICD-10-CM

## 2022-04-20 LAB — PANORAMA PRENATAL TEST FULL PANEL:PANORAMA TEST PLUS 5 ADDITIONAL MICRODELETIONS: FETAL FRACTION: 2.7

## 2022-04-21 ENCOUNTER — Telehealth: Payer: Self-pay | Admitting: Genetics

## 2022-04-21 NOTE — Telephone Encounter (Signed)
Called Kelsey Cross to return cfDNA screening results. Left voicemail with [facility] call back number.

## 2022-04-21 NOTE — Telephone Encounter (Signed)
Mark returned genetic counseling's phone call. We discussed that the repeat cfDNA screen also had insufficient fetal fraction of 2.7%. We discussed that because this is Kelsey Cross's third result with insufficient fetal fraction the laboratory will not accept a repeat sample. We discussed that amniocentesis for prenatal diagnostic studies is recommended. Kelsey Cross stated she will think about amniocentesis and let the [facility] know if she would like to pursue this testing. All questions answered.

## 2022-04-24 ENCOUNTER — Ambulatory Visit (INDEPENDENT_AMBULATORY_CARE_PROVIDER_SITE_OTHER): Payer: Medicaid Other | Admitting: Women's Health

## 2022-04-24 ENCOUNTER — Encounter: Payer: Self-pay | Admitting: Women's Health

## 2022-04-24 VITALS — BP 120/78 | HR 91 | Wt 215.0 lb

## 2022-04-24 DIAGNOSIS — Z3402 Encounter for supervision of normal first pregnancy, second trimester: Secondary | ICD-10-CM

## 2022-04-24 DIAGNOSIS — R87619 Unspecified abnormal cytological findings in specimens from cervix uteri: Secondary | ICD-10-CM | POA: Insufficient documentation

## 2022-04-24 DIAGNOSIS — Z3A25 25 weeks gestation of pregnancy: Secondary | ICD-10-CM

## 2022-04-24 NOTE — Patient Instructions (Signed)
Kelsey Cross, thank you for choosing our office today! We appreciate the opportunity to meet your healthcare needs. You may receive a short survey by mail, e-mail, or through [employer]. If you are happy with your care we would appreciate if you could take just a few minutes to complete the survey questions. We read all of your comments and take your feedback very seriously. Thank you again for choosing our office.  [facility] [employer] Team at [place] at [facility] ([place], [place] [address]) Entrance C, located off of [address] parking   You will have your sugar test next visit.  Please do not eat or drink anything after midnight the night before you come, not even water.  You will be here for at least two hours.  Please make an appointment online for the bloodwork at [email or web address] for 8:00am (or as close to this as possible). Make sure you select the [facility] service center.   CLASSES: Go to [email or web address] to register for classes (childbirth, breastfeeding, waterbirth, infant CPR, daddy bootcamp, etc.)  Call the office [phone]) or go to [facility] if: You begin to have strong, frequent contractions Your water breaks.  Sometimes it is a big gush of fluid, sometimes it is just a trickle that keeps getting your panties wet or running down your legs You have vaginal bleeding.  It is normal to have a small amount of spotting if your cervix was checked.  You don't feel your baby moving like normal.  If you don't, get you something to eat and drink and lay down and focus on feeling your baby move.   If your baby is still not moving like normal, you should call the office or go to [facility].  Call the office [phone]) or go to [facility] hospital for these signs of pre-eclampsia: Severe headache that does not go away with Tylenol Visual changes- seeing spots, double, blurred vision Pain under your right breast or upper  abdomen that does not go away with Tums or heartburn medicine Nausea and/or vomiting Severe swelling in your hands, feet, and face    [facility] Pediatricians/Family Doctors [facility]): [address] [place], [place]: [address] [place], [facility]): [place], [phone] (call to ask if accepting patients) [facility] Department: [address] [place], [facility]): [address], [address], [place] [facility]: [address] [place], [place] [facility]: [place], [facility]): [phone] [facility]: [address], [place]: [address] [place] [facility]: [phone], [phone]  Home Blood Pressure Monitoring for Patients   Your provider has recommended that you check your blood pressure (BP) at least once a week at home. If you do not have a blood pressure cuff at home, one will be provided for you. Contact your provider if you have not received your monitor within 1 week.   Helpful Tips for Accurate Home Blood Pressure Checks  Don't smoke, exercise, or drink  caffeine 30 minutes before checking your BP Use the restroom before checking your BP (a full bladder can raise your pressure) Relax in a comfortable upright chair Feet on the ground Left arm resting comfortably on a flat surface at the level of your heart Legs uncrossed Back supported Sit quietly and don't talk Place the cuff on your bare arm Adjust snuggly, so that only two fingertips can fit between your skin and the top of the cuff Check 2  readings separated by at least one minute Keep a log of your BP readings For a visual, please reference this diagram: http://ccnc.care/bpdiagram  Provider Name: [facility]     Phone: [phone]  Zone 1: ALL CLEAR  Continue to monitor your symptoms:  BP reading is less than 140 (top number) or less than 90 (bottom number)  No right upper stomach pain No headaches or seeing spots No feeling nauseated or throwing up No swelling in face and hands  Zone 2: CAUTION Call your doctor's office for any of the following:  BP reading is greater than 140 (top number) or greater than 90 (bottom number)  Stomach pain under your ribs in the middle or right side Headaches or seeing spots Feeling nauseated or throwing up Swelling in face and hands  Zone 3: EMERGENCY  Seek immediate medical care if you have any of the following:  BP reading is greater than160 (top number) or greater than 110 (bottom number) Severe headaches not improving with Tylenol Serious difficulty catching your breath Any worsening symptoms from Zone 2   Second Trimester of Pregnancy The second trimester is from week 13 through week 28, months 4 through 6. The second trimester is often a time when you feel your best. Your body has also adjusted to being pregnant, and you begin to feel better physically. Usually, morning sickness has lessened or quit completely, you may have more energy, and you may have an increase in appetite. The second trimester is also a time when the fetus is growing rapidly. At the end of the sixth month, the fetus is about 9 inches long and weighs about 1 pounds. You will likely begin to feel the baby move (quickening) between 18 and 20 weeks of the pregnancy. BODY CHANGES Your body goes through many changes during pregnancy. The changes vary from woman to woman.  Your weight will continue to increase. You will notice your lower abdomen bulging out. You may begin to get stretch marks on your  hips, abdomen, and breasts. You may develop headaches that can be relieved by medicines approved by your health care provider. You may urinate more often because the fetus is pressing on your bladder. You may develop or continue to have heartburn as a result of your pregnancy. You may develop constipation because certain hormones are causing the muscles that push waste through your intestines to slow down. You may develop hemorrhoids or swollen, bulging veins (varicose veins). You may have back pain because of the weight gain and pregnancy hormones relaxing your joints between the bones in your pelvis and as a result of a shift in weight and the muscles that support your balance. Your breasts will continue to grow and be tender. Your gums may bleed and may be sensitive to brushing and flossing. Dark spots or blotches (chloasma, mask of pregnancy) may develop on your face. This will likely fade after the baby is born. A dark line from your belly button to the pubic area (linea nigra) may appear. This  will likely fade after the baby is born. You may have changes in your hair. These can include thickening of your hair, rapid growth, and changes in texture. Some women also have hair loss during or after pregnancy, or hair that feels dry or thin. Your hair will most likely return to normal after your baby is born. WHAT TO EXPECT AT YOUR PRENATAL VISITS During a routine prenatal visit: You will be weighed to make sure you and the fetus are growing normally. Your blood pressure will be taken. Your abdomen will be measured to track your baby's growth. The fetal heartbeat will be listened to. Any test results from the previous visit will be discussed. Your health care provider may ask you: How you are feeling. If you are feeling the baby move. If you have had any abnormal symptoms, such as leaking fluid, bleeding, severe headaches, or abdominal cramping. If you have any questions. Other tests that may  be performed during your second trimester include: Blood tests that check for: Low iron levels (anemia). Gestational diabetes (between 24 and 28 weeks). Rh antibodies. Urine tests to check for infections, diabetes, or protein in the urine. An ultrasound to confirm the proper growth and development of the baby. An amniocentesis to check for possible genetic problems. Fetal screens for spina bifida and Down syndrome. HOME CARE INSTRUCTIONS  Avoid all smoking, herbs, alcohol, and unprescribed drugs. These chemicals affect the formation and growth of the baby. Follow your health care provider's instructions regarding medicine use. There are medicines that are either safe or unsafe to take during pregnancy. Exercise only as directed by your health care provider. Experiencing uterine cramps is a good sign to stop exercising. Continue to eat regular, healthy meals. Wear a good support bra for breast tenderness. Do not use hot tubs, steam rooms, or saunas. Wear your seat belt at all times when driving. Avoid raw meat, uncooked cheese, cat litter boxes, and soil used by cats. These carry germs that can cause birth defects in the baby. Take your prenatal vitamins. Try taking a stool softener (if your health care provider approves) if you develop constipation. Eat more high-fiber foods, such as fresh vegetables or fruit and whole grains. Drink plenty of fluids to keep your urine clear or pale yellow. Take warm sitz baths to soothe any pain or discomfort caused by hemorrhoids. Use hemorrhoid cream if your health care provider approves. If you develop varicose veins, wear support hose. Elevate your feet for 15 minutes, 3-4 times a day. Limit salt in your diet. Avoid heavy lifting, wear low heel shoes, and practice good posture. Rest with your legs elevated if you have leg cramps or low back pain. Visit your dentist if you have not gone yet during your pregnancy. Use a soft toothbrush to brush your teeth  and be gentle when you floss. A sexual relationship may be continued unless your health care provider directs you otherwise. Continue to go to all your prenatal visits as directed by your health care provider. SEEK MEDICAL CARE IF:  You have dizziness. You have mild pelvic cramps, pelvic pressure, or nagging pain in the abdominal area. You have persistent nausea, vomiting, or diarrhea. You have a bad smelling vaginal discharge. You have pain with urination. SEEK IMMEDIATE MEDICAL CARE IF:  You have a fever. You are leaking fluid from your vagina. You have spotting or bleeding from your vagina. You have severe abdominal cramping or pain. You have rapid weight gain or loss. You have shortness of   breath with chest pain. You notice sudden or extreme swelling of your face, hands, ankles, feet, or legs. You have not felt your baby move in over an hour. You have severe headaches that do not go away with medicine. You have vision changes. Document Released: 05/30/2001 Document Revised: 06/10/2013 Document Reviewed: 08/06/2012 [facility] Patient Information 2015 [place], [place]. This information is not intended to replace advice given to you by your health care provider. Make sure you discuss any questions you have with your health care provider.

## 2022-04-24 NOTE — Progress Notes (Signed)
LOW-RISK PREGNANCY VISIT Patient name: Kelsey Cross MRN [id number]  Date of birth: [birth year] Chief Complaint:   Routine Prenatal Visit  History of Present Illness:   Kelsey Cross is a 26 y.o. G1P0 female at [redacted]w[redacted]d with an Estimated Date of Delivery: 08/02/22 being seen today for ongoing management of a low-risk pregnancy.   Today she reports no complaints. Contractions: Not present. Vag. Bleeding: None.  Movement: Present. denies leaking of fluid.     01/27/2022   11:44 AM 04/03/2016   12:25 PM 04/03/2016   12:19 PM  Depression screen PHQ 2/9  Decreased Interest 0 0 0  Down, Depressed, Hopeless 0 0 0  PHQ - 2 Score 0 0 0  Altered sleeping 0 0   Tired, decreased energy 1 0   Change in appetite 1 0   Feeling bad or failure about yourself  0 0   Trouble concentrating 0 0   Moving slowly or fidgety/restless 0 0   Suicidal thoughts 0 0   PHQ-9 Score 2 0         01/27/2022   11:44 AM  GAD 7 : Generalized Anxiety Score  Nervous, Anxious, on Edge 1  Control/stop worrying 0  Worry too much - different things 1  Trouble relaxing 0  Restless 0  Easily annoyed or irritable 1  Afraid - awful might happen 1  Total GAD 7 Score 4      Review of Systems:   Pertinent items are noted in HPI Denies abnormal vaginal discharge w/ itching/odor/irritation, headaches, visual changes, shortness of breath, chest pain, abdominal pain, severe nausea/vomiting, or problems with urination or bowel movements unless otherwise stated above. Pertinent History Reviewed:  Reviewed past medical,surgical, social, obstetrical and family history.  Reviewed problem list, medications and allergies. Physical Assessment:   Vitals:   04/24/22 1619  BP: 120/78  Pulse: 91  Weight: 215 lb (97.5 kg)  Body mass index is 37.49 kg/m.        Physical Examination:   General appearance: Well appearing, and in no distress  Mental status: Alert, oriented to person, place, and time  Skin: Warm &  dry  Cardiovascular: Normal heart rate noted  Respiratory: Normal respiratory effort, no distress  Abdomen: Soft, gravid, nontender  Pelvic: Cervical exam deferred         Extremities:    Fetal Status: Fetal Heart Rate (bpm): 135 Fundal Height: 27 cm Movement: Present    Chaperone: N/A   No results found for this or any previous visit (from the past 24 hour(s)).  Assessment & Plan:  1) Low-risk pregnancy G1P0 at [redacted]w[redacted]d with an Estimated Date of Delivery: 08/02/22   2) High risk for T13/18 d/t low fetal fraction on NIPS, increased r/f T18 on IT, repeat NIPS insufficient DNA x 2, has seen MFM, declined amnio, normal u/s. Has f/u u/s w/ MFM on 11/29   Meds: No orders of the defined types were placed in this encounter.  Labs/procedures today: declined flu shot  Plan:  Continue routine obstetrical care  Next visit: prefers will be in person for pn2     Reviewed: Preterm labor symptoms and general obstetric precautions including but not limited to vaginal bleeding, contractions, leaking of fluid and fetal movement were reviewed in detail with the patient.  All questions were answered. Does have home bp cuff. Office bp cuff given: not applicable. Check bp weekly, let us know if consistently >140 and/or >90.  Follow-up: Return in about 2 weeks (around 05/08/2022)  for LROB, PN2, MD or CNM, in person.  Future Appointments  Date Time Provider Department Center  05/17/2022  2:30 PM [facility] NURSE [facility]  05/17/2022  2:45 PM [facility] US6 [facility]  05/22/2022  4:10 PM Cheral Marker, CNM [facility]    No orders of the defined types were placed in this encounter.  Cheral Marker CNM, [facility] 04/24/2022 4:37 PM

## 2022-05-01 ENCOUNTER — Other Ambulatory Visit: Payer: Self-pay | Admitting: Obstetrics and Gynecology

## 2022-05-01 ENCOUNTER — Telehealth: Payer: Self-pay | Admitting: Genetics

## 2022-05-01 DIAGNOSIS — O280 Abnormal hematological finding on antenatal screening of mother: Secondary | ICD-10-CM

## 2022-05-01 DIAGNOSIS — Z362 Encounter for other antenatal screening follow-up: Secondary | ICD-10-CM

## 2022-05-01 DIAGNOSIS — Z3689 Encounter for other specified antenatal screening: Secondary | ICD-10-CM

## 2022-05-01 NOTE — Telephone Encounter (Signed)
Kelsey Cross called into genetic counseling and stated she would like to move forward to pursue amniocentesis for prenatal diagnosis. Genetic counseling will have scheduling call Tommye with the amniocentesis appointment date and time. All questions answered.

## 2022-05-08 ENCOUNTER — Other Ambulatory Visit: Payer: Medicaid Other

## 2022-05-08 ENCOUNTER — Encounter: Payer: Self-pay | Admitting: Obstetrics & Gynecology

## 2022-05-08 ENCOUNTER — Ambulatory Visit (INDEPENDENT_AMBULATORY_CARE_PROVIDER_SITE_OTHER): Payer: Medicaid Other | Admitting: Obstetrics & Gynecology

## 2022-05-08 VITALS — BP 116/74 | HR 67 | Wt 220.2 lb

## 2022-05-08 DIAGNOSIS — Z3401 Encounter for supervision of normal first pregnancy, first trimester: Secondary | ICD-10-CM

## 2022-05-08 DIAGNOSIS — Z3A27 27 weeks gestation of pregnancy: Secondary | ICD-10-CM

## 2022-05-08 DIAGNOSIS — Z3402 Encounter for supervision of normal first pregnancy, second trimester: Secondary | ICD-10-CM

## 2022-05-08 DIAGNOSIS — Z3A13 13 weeks gestation of pregnancy: Secondary | ICD-10-CM

## 2022-05-08 DIAGNOSIS — Z131 Encounter for screening for diabetes mellitus: Secondary | ICD-10-CM

## 2022-05-08 MED ORDER — BLOOD PRESSURE MONITOR MISC: 0 refills | Status: DC

## 2022-05-08 NOTE — Progress Notes (Signed)
   LOW-RISK PREGNANCY VISIT Patient name: Kelsey Cross MRN [id number]  Date of birth: [birth year] Chief Complaint:   Routine Prenatal Visit  History of Present Illness:   Kelsey Cross is a 26 y.o. G1P0 female at [redacted]w[redacted]d with an Estimated Date of Delivery: 08/02/22 being seen today for ongoing management of a low-risk pregnancy.      01/27/2022   11:44 AM 04/03/2016   12:25 PM 04/03/2016   12:19 PM  Depression screen PHQ 2/9  Decreased Interest 0 0 0  Down, Depressed, Hopeless 0 0 0  PHQ - 2 Score 0 0 0  Altered sleeping 0 0   Tired, decreased energy 1 0   Change in appetite 1 0   Feeling bad or failure about yourself  0 0   Trouble concentrating 0 0   Moving slowly or fidgety/restless 0 0   Suicidal thoughts 0 0   PHQ-9 Score 2 0     Today she reports no complaints. Contractions: Not present. Vag. Bleeding: None.  Movement: Present. denies leaking of fluid. Review of Systems:   Pertinent items are noted in HPI Denies abnormal vaginal discharge w/ itching/odor/irritation, headaches, visual changes, shortness of breath, chest pain, abdominal pain, severe nausea/vomiting, or problems with urination or bowel movements unless otherwise stated above. Pertinent History Reviewed:  Reviewed past medical,surgical, social, obstetrical and family history.  Reviewed problem list, medications and allergies.  Physical Assessment:   Vitals:   05/08/22 0842  BP: 116/74  Pulse: 67  Weight: 220 lb 3.2 oz (99.9 kg)  Body mass index is 38.39 kg/m.        Physical Examination:   General appearance: Well appearing, and in no distress  Mental status: Alert, oriented to person, place, and time  Skin: Warm & dry  Respiratory: Normal respiratory effort, no distress  Abdomen: Soft, gravid, nontender  Pelvic: Cervical exam deferred         Extremities: Edema: None  Psych:  mood and affect appropriate  Fetal Status: Fetal Heart Rate (bpm): 140 Fundal Height: 28 cm Movement: Present     Chaperone: n/a    No results found for this or any previous visit (from the past 24 hour(s)).   Assessment & Plan:  1) Low-risk pregnancy G1P0 at [redacted]w[redacted]d with an Estimated Date of Delivery: 08/02/22   -high T13- normal [place]   Meds:  Meds ordered this encounter  Medications   Blood Pressure Monitor MISC    Sig: For regular home bp monitoring during pregnancy    Dispense:  1 each    Refill:  0    Z34.81 Please mail to patient   Labs/procedures today: PN2  Plan:  Continue routine obstetrical care  Next visit: prefers in person    Reviewed: Preterm labor symptoms and general obstetric precautions including but not limited to vaginal bleeding, contractions, leaking of fluid and fetal movement were reviewed in detail with the patient.  All questions were answered. Pt does not have a home bp cuff. Prescription sent in today  Follow-up: Return in about 3 weeks (around 05/29/2022) for LROB visit.  No orders of the defined types were placed in this encounter.   Myna Hidalgo, DO Attending Obstetrician & Gynecologist, [facility] for [employer], [facility]

## 2022-05-08 NOTE — Progress Notes (Deleted)
  1 Patient Communication     2 HM Topics      Component Ref Range & Units 3 mo ago 6 yr ago  Hepatitis B Surface Ag Negative Negative   HCV Ab Non Reactive Non Reactive   RPR Ser Ql Non Reactive Non Reactive   Rubella Antibodies, IGG Immune >0.99 index 0.95 Low    Comment: A second sample should be collected and tested no less than 2-4 weeks.                                 Non-immune       <0.90                                 Equivocal  0.90 - 0.99                                 Immune           >0.99  ABO Grouping  O   Rh Factor  Positive   Comment: Please note: Prior records for this patient's ABO / Rh type are not available for additional verification.  Antibody Screen Negative Negative   HIV Screen 4th Generation wRfx Non Reactive Non Reactive   Comment: HIV Negative HIV-1/HIV-2 antibodies and HIV-1 p24 antigen were NOT detected. There is no laboratory evidence of HIV infection.    Patient scheduled for amniocentesis on 05/17/22. Required lab results above.

## 2022-05-09 LAB — CBC
Hematocrit: 35.9 % (ref 34.0–46.6)
Hemoglobin: 12.3 g/dL (ref 11.1–15.9)
MCH: 29.4 pg (ref 26.6–33.0)
MCHC: 34.3 g/dL (ref 31.5–35.7)
MCV: 86 fL (ref 79–97)
Platelets: 318 10*3/uL (ref 150–450)
RBC: 4.19 x10E6/uL (ref 3.77–5.28)
RDW: 11.4 % — ABNORMAL LOW (ref 11.7–15.4)
WBC: 12.1 10*3/uL — ABNORMAL HIGH (ref 3.4–10.8)

## 2022-05-09 LAB — HIV ANTIBODY (ROUTINE TESTING W REFLEX): HIV Screen 4th Generation wRfx: NONREACTIVE

## 2022-05-09 LAB — RPR: RPR Ser Ql: NONREACTIVE

## 2022-05-09 LAB — GLUCOSE TOLERANCE, 2 HOURS W/ 1HR
Glucose, 1 hour: 122 mg/dL (ref 70–179)
Glucose, 2 hour: 120 mg/dL (ref 70–152)
Glucose, Fasting: 81 mg/dL (ref 70–91)

## 2022-05-09 LAB — ANTIBODY SCREEN: Antibody Screen: NEGATIVE

## 2022-05-17 ENCOUNTER — Ambulatory Visit (HOSPITAL_BASED_OUTPATIENT_CLINIC_OR_DEPARTMENT_OTHER): Payer: Medicaid Other | Admitting: Obstetrics

## 2022-05-17 ENCOUNTER — Ambulatory Visit: Payer: Medicaid Other | Admitting: *Deleted

## 2022-05-17 ENCOUNTER — Ambulatory Visit: Payer: Medicaid Other

## 2022-05-17 ENCOUNTER — Ambulatory Visit: Payer: Medicaid Other | Attending: Obstetrics and Gynecology

## 2022-05-17 VITALS — BP 124/68 | HR 92

## 2022-05-17 DIAGNOSIS — Z362 Encounter for other antenatal screening follow-up: Secondary | ICD-10-CM | POA: Diagnosis not present

## 2022-05-17 DIAGNOSIS — O99213 Obesity complicating pregnancy, third trimester: Secondary | ICD-10-CM | POA: Insufficient documentation

## 2022-05-17 DIAGNOSIS — O285 Abnormal chromosomal and genetic finding on antenatal screening of mother: Secondary | ICD-10-CM

## 2022-05-17 DIAGNOSIS — O280 Abnormal hematological finding on antenatal screening of mother: Secondary | ICD-10-CM

## 2022-05-17 DIAGNOSIS — E669 Obesity, unspecified: Secondary | ICD-10-CM

## 2022-05-17 DIAGNOSIS — Z3A29 29 weeks gestation of pregnancy: Secondary | ICD-10-CM

## 2022-05-17 DIAGNOSIS — Z3689 Encounter for other specified antenatal screening: Secondary | ICD-10-CM | POA: Insufficient documentation

## 2022-05-17 NOTE — Progress Notes (Signed)
MFM Note  Kelsey Cross was seen for a follow up exam as her cell free DNA test indicated a high risk for trisomy 18 and 13 due to a low fetal fraction.  She has had 3 cell free DNA tests drawn which all indicated a low fetal fraction.  She then had an integrated test which showed an increased trisomy 18 risk of 1:81.  She denies any other problems in her current pregnancy.  She was informed that the fetal growth and amniotic fluid level appears appropriate for her gestational age.  The views of the fetal anatomy remain limited due to the fetal position and maternal body habitus.  However, there were no signs or markers associated with trisomy 18 noted on today's exam.  The risks versus benefits of an amniocentesis procedure for definitive diagnosis of fetal chromosomal abnormalities was discussed today.  The patient declined an amniocentesis today.    She will have her baby tested after birth to determine if any chromosomal abnormalities are present.  Due to maternal obesity and her abnormal screening tests, we will start weekly fetal testing at 32 to 33 weeks.    She will return in 4 weeks for a BPP and growth scan.    The patient stated that all of her questions have been answered today.  A total of 20 minutes was spent counseling and coordinating the care for this patient.  Greater than 50% of the time was spent in direct face-to-face contact.

## 2022-05-22 ENCOUNTER — Encounter: Payer: Medicaid Other | Admitting: Women's Health

## 2022-05-22 ENCOUNTER — Other Ambulatory Visit: Payer: Self-pay | Admitting: *Deleted

## 2022-05-22 DIAGNOSIS — O280 Abnormal hematological finding on antenatal screening of mother: Secondary | ICD-10-CM

## 2022-05-22 DIAGNOSIS — O99213 Obesity complicating pregnancy, third trimester: Secondary | ICD-10-CM

## 2022-05-29 ENCOUNTER — Ambulatory Visit (INDEPENDENT_AMBULATORY_CARE_PROVIDER_SITE_OTHER): Payer: Medicaid Other | Admitting: Women's Health

## 2022-05-29 ENCOUNTER — Encounter: Payer: Self-pay | Admitting: Women's Health

## 2022-05-29 VITALS — BP 129/74 | HR 90 | Wt 228.0 lb

## 2022-05-29 DIAGNOSIS — Z3A30 30 weeks gestation of pregnancy: Secondary | ICD-10-CM

## 2022-05-29 DIAGNOSIS — Z23 Encounter for immunization: Secondary | ICD-10-CM

## 2022-05-29 DIAGNOSIS — Z3403 Encounter for supervision of normal first pregnancy, third trimester: Secondary | ICD-10-CM

## 2022-05-29 DIAGNOSIS — O285 Abnormal chromosomal and genetic finding on antenatal screening of mother: Secondary | ICD-10-CM

## 2022-05-29 NOTE — Progress Notes (Signed)
LOW-RISK PREGNANCY VISIT Patient name: Kelsey Cross MRN [id number]  Date of birth: 10-26-1995 Chief Complaint:   Routine Prenatal Visit  History of Present Illness:   Kelsey Cross is a 26 y.o. G1P0 female at [redacted]w[redacted]d with an Estimated Date of Delivery: 08/02/22 being seen today for ongoing management of a low-risk pregnancy.   Today she reports no complaints. Contractions: Not present. Vag. Bleeding: None.  Movement: Present. denies leaking of fluid.     01/27/2022   11:44 AM 04/03/2016   12:25 PM 04/03/2016   12:19 PM  Depression screen PHQ 2/9  Decreased Interest 0 0 0  Down, Depressed, Hopeless 0 0 0  PHQ - 2 Score 0 0 0  Altered sleeping 0 0   Tired, decreased energy 1 0   Change in appetite 1 0   Feeling bad or failure about yourself  0 0   Trouble concentrating 0 0   Moving slowly or fidgety/restless 0 0   Suicidal thoughts 0 0   PHQ-9 Score 2 0         01/27/2022   11:44 AM  GAD 7 : Generalized Anxiety Score  Nervous, Anxious, on Edge 1  Control/stop worrying 0  Worry too much - different things 1  Trouble relaxing 0  Restless 0  Easily annoyed or irritable 1  Afraid - awful might happen 1  Total GAD 7 Score 4      Review of Systems:   Pertinent items are noted in HPI Denies abnormal vaginal discharge w/ itching/odor/irritation, headaches, visual changes, shortness of breath, chest pain, abdominal pain, severe nausea/vomiting, or problems with urination or bowel movements unless otherwise stated above. Pertinent History Reviewed:  Reviewed past medical,surgical, social, obstetrical and family history.  Reviewed problem list, medications and allergies. Physical Assessment:   Vitals:   05/29/22 1531  BP: 129/74  Pulse: 90  Weight: 228 lb (103.4 kg)  Body mass index is 39.75 kg/m.        Physical Examination:   General appearance: Well appearing, and in no distress  Mental status: Alert, oriented to person, place, and time  Skin: Warm &  dry  Cardiovascular: Normal heart rate noted  Respiratory: Normal respiratory effort, no distress  Abdomen: Soft, gravid, nontender  Pelvic: Cervical exam deferred         Extremities:    Fetal Status: Fetal Heart Rate (bpm): 145 Fundal Height: 32 cm Movement: Present    Chaperone: N/A   No results found for this or any previous visit (from the past 24 hour(s)).  Assessment & Plan:  1) Low-risk pregnancy G1P0 at [redacted]w[redacted]d with an Estimated Date of Delivery: 08/02/22   2) High risk T13/18, d/t low fetal fraction on NIPS, increased r/f T18 on IT, repeat NIPS insufficient fetal DNA x 2, has seen MFM, declined amnio. Normal u/s. Doing f/u growths w/ them   Meds: No orders of the defined types were placed in this encounter.  Labs/procedures today: tdap  Plan:  Continue routine obstetrical care  Next visit: prefers in person    Reviewed: Preterm labor symptoms and general obstetric precautions including but not limited to vaginal bleeding, contractions, leaking of fluid and fetal movement were reviewed in detail with the patient.  All questions were answered. Does have home bp cuff. Office bp cuff given: not applicable. Check bp weekly, let us know if consistently >140 and/or >90.  Follow-up: Return in about 2 weeks (around 06/12/2022) for LROB, MD/CNM, in person.  Future Appointments  Date Time Provider Department Center  06/15/2022  3:15 PM [facility] NURSE [facility]  06/15/2022  3:30 PM [facility] US2 [facility]  06/16/2022 12:10 PM Jacklyn Shell, CNM [facility]  06/22/2022  2:30 PM [facility] NURSE [facility]  06/22/2022  2:45 PM [facility] US4 [facility]  06/29/2022  2:45 PM [facility] NURSE [facility]  06/29/2022  3:00 PM [facility] US1 [facility]    Orders Placed This Encounter  Procedures   Tdap vaccine greater than or equal to 7yo IM   Cheral Marker CNM, [facility] 05/29/2022 4:28 PM

## 2022-05-29 NOTE — Patient Instructions (Signed)
Kelsey Cross, thank you for choosing our office today! We appreciate the opportunity to meet your healthcare needs. You may receive a short survey by mail, e-mail, or through [employer]. If you are happy with your care we would appreciate if you could take just a few minutes to complete the survey questions. We read all of your comments and take your feedback very seriously. Thank you again for choosing our office.  Center for [employer] Team at [facility]  [facility] at [facility] ([address] [place], [place] [address]) Entrance C, located off of [address] Free 24/7 valet parking   CLASSES: Go to [email or web address] to register for classes (childbirth, breastfeeding, waterbirth, infant CPR, daddy bootcamp, etc.)  Call the office [phone]) or go to [facility] if: You begin to have strong, frequent contractions Your water breaks.  Sometimes it is a big gush of fluid, sometimes it is just a trickle that keeps getting your panties wet or running down your legs You have vaginal bleeding.  It is normal to have a small amount of spotting if your cervix was checked.  You don't feel your baby moving like normal.  If you don't, get you something to eat and drink and lay down and focus on feeling your baby move.   If your baby is still not moving like normal, you should call the office or go to [facility].  Call the office [phone]) or go to [facility] hospital for these signs of pre-eclampsia: Severe headache that does not go away with Tylenol Visual changes- seeing spots, double, blurred vision Pain under your right breast or upper abdomen that does not go away with Tums or heartburn medicine Nausea and/or vomiting Severe swelling in your hands, feet, and face   Tdap Vaccine It is recommended that you get the Tdap vaccine during the third trimester of EACH pregnancy to help protect your baby from getting pertussis (whooping cough) 27-36 weeks is the BEST time to do  this so that you can pass the protection on to your baby. During pregnancy is better than after pregnancy, but if you are unable to get it during pregnancy it will be offered at the hospital.  You can get this vaccine with [place], at the health department, your family doctor, or some local pharmacies Everyone who will be around your baby should also be up-to-date on their vaccines before the baby comes. Adults (who are not pregnant) only need 1 dose of Tdap during adulthood.   [facility] Pediatricians/Family Doctors [facility]): [address] Colette Ribas, [phone]           [facility]: [address], [phone]                [facility]): [address], [phone] (call to ask if accepting patients) [facility] Department: [address], [place], [phone]    [facility] Pediatricians/Family Doctors [facility]): [phone], [phone] [facility]: [address] [place], [phone] [facility]: [address], [phone]  [facility] Doctors  [facility]): [phone] [facility]: [address] [phone]   [facility] Doctors [facility]: [address], [phone]  [facility] Family Doctors  [facility]: [phone], [phone]  Home Blood Pressure Monitoring for Patients   Your provider has recommended that you check your  blood pressure (BP) at least once a week at home. If you do not have a blood pressure cuff at home, one will be provided for you. Contact your provider if you have not received your monitor within 1 week.   Helpful Tips for Accurate Home Blood Pressure Checks  Don't smoke, exercise, or drink caffeine 30 minutes before checking your BP Use the restroom before checking your BP (a full bladder can raise your  pressure) Relax in a comfortable upright chair Feet on the ground Left arm resting comfortably on a flat surface at the level of your heart Legs uncrossed Back supported Sit quietly and don't talk Place the cuff on your bare arm Adjust snuggly, so that only two fingertips can fit between your skin and the top of the cuff Check 2 readings separated by at least one minute Keep a log of your BP readings For a visual, please reference this diagram: http://ccnc.care/bpdiagram  Provider Name: [facility]     Phone: [phone]  Zone 1: ALL CLEAR  Continue to monitor your symptoms:  BP reading is less than 140 (top number) or less than 90 (bottom number)  No right upper stomach pain No headaches or seeing spots No feeling nauseated or throwing up No swelling in face and hands  Zone 2: CAUTION Call your doctor's office for any of the following:  BP reading is greater than 140 (top number) or greater than 90 (bottom number)  Stomach pain under your ribs in the middle or right side Headaches or seeing spots Feeling nauseated or throwing up Swelling in face and hands  Zone 3: EMERGENCY  Seek immediate medical care if you have any of the following:  BP reading is greater than160 (top number) or greater than 110 (bottom number) Severe headaches not improving with Tylenol Serious difficulty catching your breath Any worsening symptoms from Zone 2   Third Trimester of Pregnancy The third trimester is from week 29 through week 42, months 7 through 9. The third trimester is a time when the fetus is growing rapidly. At the end of the ninth month, the fetus is about 20 inches in length and weighs 6-10 pounds.  BODY CHANGES Your body goes through many changes during pregnancy. The changes vary from woman to woman.  Your weight will continue to increase. You can expect to gain 25-35 pounds (11-16 kg) by the end of the pregnancy. You may begin to get stretch marks on your hips, abdomen,  and breasts. You may urinate more often because the fetus is moving lower into your pelvis and pressing on your bladder. You may develop or continue to have heartburn as a result of your pregnancy. You may develop constipation because certain hormones are causing the muscles that push waste through your intestines to slow down. You may develop hemorrhoids or swollen, bulging veins (varicose veins). You may have pelvic pain because of the weight gain and pregnancy hormones relaxing your joints between the bones in your pelvis. Backaches may result from overexertion of the muscles supporting your posture. You may have changes in your hair. These can include thickening of your hair, rapid growth, and changes in texture. Some women also have hair loss during or after pregnancy, or hair that feels dry or thin. Your hair will most likely return to normal after your baby is born. Your breasts will continue to grow and be tender. A yellow discharge may leak from your breasts called colostrum. Your belly button may stick out. You may   feel short of breath because of your expanding uterus. You may notice the fetus "dropping," or moving lower in your abdomen. You may have a bloody mucus discharge. This usually occurs a few days to a week before labor begins. Your cervix becomes thin and soft (effaced) near your due date. WHAT TO EXPECT AT YOUR PRENATAL EXAMS  You will have prenatal exams every 2 weeks until week 36. Then, you will have weekly prenatal exams. During a routine prenatal visit: You will be weighed to make sure you and the fetus are growing normally. Your blood pressure is taken. Your abdomen will be measured to track your baby's growth. The fetal heartbeat will be listened to. Any test results from the previous visit will be discussed. You may have a cervical check near your due date to see if you have effaced. At around 36 weeks, your caregiver will check your cervix. At the same time, your  caregiver will also perform a test on the secretions of the vaginal tissue. This test is to determine if a type of bacteria, Group B streptococcus, is present. Your caregiver will explain this further. Your caregiver may ask you: What your birth plan is. How you are feeling. If you are feeling the baby move. If you have had any abnormal symptoms, such as leaking fluid, bleeding, severe headaches, or abdominal cramping. If you have any questions. Other tests or screenings that may be performed during your third trimester include: Blood tests that check for low iron levels (anemia). Fetal testing to check the health, activity level, and growth of the fetus. Testing is done if you have certain medical conditions or if there are problems during the pregnancy. FALSE LABOR You may feel small, irregular contractions that eventually go away. These are called Braxton Hicks contractions, or false labor. Contractions may last for hours, days, or even weeks before true labor sets in. If contractions come at regular intervals, intensify, or become painful, it is best to be seen by your caregiver.  SIGNS OF LABOR  Menstrual-like cramps. Contractions that are 5 minutes apart or less. Contractions that start on the top of the uterus and spread down to the lower abdomen and back. A sense of increased pelvic pressure or back pain. A watery or bloody mucus discharge that comes from the vagina. If you have any of these signs before the 37th week of pregnancy, call your caregiver right away. You need to go to the hospital to get checked immediately. HOME CARE INSTRUCTIONS  Avoid all smoking, herbs, alcohol, and unprescribed drugs. These chemicals affect the formation and growth of the baby. Follow your caregiver's instructions regarding medicine use. There are medicines that are either safe or unsafe to take during pregnancy. Exercise only as directed by your caregiver. Experiencing uterine cramps is a good sign to  stop exercising. Continue to eat regular, healthy meals. Wear a good support bra for breast tenderness. Do not use hot tubs, steam rooms, or saunas. Wear your seat belt at all times when driving. Avoid raw meat, uncooked cheese, cat litter boxes, and soil used by cats. These carry germs that can cause birth defects in the baby. Take your prenatal vitamins. Try taking a stool softener (if your caregiver approves) if you develop constipation. Eat more high-fiber foods, such as fresh vegetables or fruit and whole grains. Drink plenty of fluids to keep your urine clear or pale yellow. Take warm sitz baths to soothe any pain or discomfort caused by hemorrhoids. Use hemorrhoid cream if   your caregiver approves. If you develop varicose veins, wear support hose. Elevate your feet for 15 minutes, 3-4 times a day. Limit salt in your diet. Avoid heavy lifting, wear low heal shoes, and practice good posture. Rest a lot with your legs elevated if you have leg cramps or low back pain. Visit your dentist if you have not gone during your pregnancy. Use a soft toothbrush to brush your teeth and be gentle when you floss. A sexual relationship may be continued unless your caregiver directs you otherwise. Do not travel far distances unless it is absolutely necessary and only with the approval of your caregiver. Take prenatal classes to understand, practice, and ask questions about the labor and delivery. Make a trial run to the hospital. Pack your hospital bag. Prepare the baby's nursery. Continue to go to all your prenatal visits as directed by your caregiver. SEEK MEDICAL CARE IF: You are unsure if you are in labor or if your water has broken. You have dizziness. You have mild pelvic cramps, pelvic pressure, or nagging pain in your abdominal area. You have persistent nausea, vomiting, or diarrhea. You have a bad smelling vaginal discharge. You have pain with urination. SEEK IMMEDIATE MEDICAL CARE IF:  You  have a fever. You are leaking fluid from your vagina. You have spotting or bleeding from your vagina. You have severe abdominal cramping or pain. You have rapid weight loss or gain. You have shortness of breath with chest pain. You notice sudden or extreme swelling of your face, hands, ankles, feet, or legs. You have not felt your baby move in over an hour. You have severe headaches that do not go away with medicine. You have vision changes. Document Released: 05/30/2001 Document Revised: 06/10/2013 Document Reviewed: 08/06/2012 ExitCare Patient Information 2015 [employer]. This information is not intended to replace advice given to you by your health care provider. Make sure you discuss any questions you have with your health care provider.       

## 2022-06-14 ENCOUNTER — Encounter: Payer: Self-pay | Admitting: Obstetrics & Gynecology

## 2022-06-15 ENCOUNTER — Ambulatory Visit: Payer: Medicaid Other | Attending: Obstetrics

## 2022-06-15 ENCOUNTER — Ambulatory Visit: Payer: Medicaid Other | Admitting: *Deleted

## 2022-06-15 VITALS — BP 129/70 | HR 104

## 2022-06-15 DIAGNOSIS — E669 Obesity, unspecified: Secondary | ICD-10-CM | POA: Diagnosis not present

## 2022-06-15 DIAGNOSIS — O280 Abnormal hematological finding on antenatal screening of mother: Secondary | ICD-10-CM | POA: Insufficient documentation

## 2022-06-15 DIAGNOSIS — Z3A33 33 weeks gestation of pregnancy: Secondary | ICD-10-CM

## 2022-06-15 DIAGNOSIS — O285 Abnormal chromosomal and genetic finding on antenatal screening of mother: Secondary | ICD-10-CM | POA: Insufficient documentation

## 2022-06-15 DIAGNOSIS — O99213 Obesity complicating pregnancy, third trimester: Secondary | ICD-10-CM | POA: Insufficient documentation

## 2022-06-15 DIAGNOSIS — Z362 Encounter for other antenatal screening follow-up: Secondary | ICD-10-CM | POA: Diagnosis not present

## 2022-06-16 ENCOUNTER — Encounter: Payer: Self-pay | Admitting: Family Medicine

## 2022-06-16 ENCOUNTER — Ambulatory Visit (INDEPENDENT_AMBULATORY_CARE_PROVIDER_SITE_OTHER): Payer: Medicaid Other | Admitting: Family Medicine

## 2022-06-16 ENCOUNTER — Other Ambulatory Visit (HOSPITAL_COMMUNITY)
Admission: RE | Admit: 2022-06-16 | Discharge: 2022-06-16 | Disposition: A | Discharge status: Home / Self Care | Payer: Medicaid Other | Source: Ambulatory Visit | Attending: Advanced Practice Midwife | Admitting: Advanced Practice Midwife

## 2022-06-16 VITALS — BP 124/81 | HR 106 | Wt 235.2 lb

## 2022-06-16 DIAGNOSIS — Z3A33 33 weeks gestation of pregnancy: Secondary | ICD-10-CM

## 2022-06-16 DIAGNOSIS — O26893 Other specified pregnancy related conditions, third trimester: Secondary | ICD-10-CM | POA: Insufficient documentation

## 2022-06-16 DIAGNOSIS — N898 Other specified noninflammatory disorders of vagina: Secondary | ICD-10-CM | POA: Insufficient documentation

## 2022-06-16 DIAGNOSIS — Z3403 Encounter for supervision of normal first pregnancy, third trimester: Secondary | ICD-10-CM

## 2022-06-16 NOTE — Progress Notes (Cosign Needed)
LOW-RISK PREGNANCY VISIT Patient name: Kelsey Cross MRN [id number]  Date of birth: 21-Sep-1995 Chief Complaint:   Routine Prenatal Visit (Green vaginal discharge X 1 week)  History of Present Illness:   Kelsey Cross is a 26 y.o. G1P0 female at [redacted]w[redacted]d with an Estimated Date of Delivery: 08/02/22 being seen today for ongoing management of a low-risk pregnancy.   Today she reports vaginal discharge. Contractions: Irritability. Vag. Bleeding: None.  Movement: Present. denies leaking of fluid.     01/27/2022   11:44 AM 04/03/2016   12:25 PM 04/03/2016   12:19 PM  Depression screen PHQ 2/9  Decreased Interest 0 0 0  Down, Depressed, Hopeless 0 0 0  PHQ - 2 Score 0 0 0  Altered sleeping 0 0   Tired, decreased energy 1 0   Change in appetite 1 0   Feeling bad or failure about yourself  0 0   Trouble concentrating 0 0   Moving slowly or fidgety/restless 0 0   Suicidal thoughts 0 0   PHQ-9 Score 2 0         01/27/2022   11:44 AM  GAD 7 : Generalized Anxiety Score  Nervous, Anxious, on Edge 1  Control/stop worrying 0  Worry too much - different things 1  Trouble relaxing 0  Restless 0  Easily annoyed or irritable 1  Afraid - awful might happen 1  Total GAD 7 Score 4      Review of Systems:   Pertinent items are noted in HPI Denies abnormal vaginal discharge w/ itching/odor/irritation, headaches, visual changes, shortness of breath, chest pain, abdominal pain, severe nausea/vomiting, or problems with urination or bowel movements unless otherwise stated above. Pertinent History Reviewed:  Reviewed past medical,surgical, social, obstetrical and family history.  Reviewed problem list, medications and allergies. Physical Assessment:   Vitals:   06/16/22 1059  BP: 124/81  Pulse: (!) 106  Weight: 235 lb 3.2 oz (106.7 kg)  Body mass index is 41.01 kg/m.        Physical Examination:   General appearance: Well appearing, and in no distress  Mental status: Alert, oriented  to person, place, and time  Skin: Warm & dry  Cardiovascular: Normal heart rate noted  Respiratory: Normal respiratory effort, no distress  Abdomen: Soft, gravid, nontender  Pelvic: Cervical exam deferred         Extremities:    Fetal Status:     Movement: Present    Chaperone: N/A   No results found for this or any previous visit (from the past 24 hour(s)).  Assessment & Plan:  1) Low-risk pregnancy G1P0 at [redacted]w[redacted]d with an Estimated Date of Delivery: 08/02/22   2) vaginal discharge-self swab collected today.  Will call patient with results if there are any abnormalities and treat as needed  3) high risk T13/18-low fetal fraction on nips.  Has seen MFM and declined amniocentesis.  Normal ultrasounds.  Continuing to follow with MFM for serial growth ultrasounds   Meds: No orders of the defined types were placed in this encounter.  Labs/procedures today: CV swab  Plan:  Continue routine obstetrical care  Next visit: prefers in person    Reviewed: Preterm labor symptoms and general obstetric precautions including but not limited to vaginal bleeding, contractions, leaking of fluid and fetal movement were reviewed in detail with the patient.  All questions were answered. Does have home bp cuff. Office bp cuff given: not applicable. Check bp weekly, let us know if consistently >140  and/or >90.  Follow-up: No follow-ups on file.  Future Appointments  Date Time Provider Department Center  06/22/2022  2:30 PM [facility] NURSE [facility]  06/22/2022  2:45 PM [facility] US4 [facility]  06/29/2022  2:45 PM [facility] NURSE [facility]  06/29/2022  3:00 PM [facility] US1 [facility]  06/30/2022 10:30 AM Arabella Merles, CNM [facility]  07/07/2022 12:50 PM Despina Hidden Amaryllis Dyke, MD [facility]  07/14/2022 11:50 AM Lazaro Arms, MD [facility]  07/21/2022 10:50 AM Marny Lowenstein, PA-C [facility]  07/28/2022 11:50 AM Hermina Staggers, MD [facility]    No orders of the defined types were placed in  this encounter.  Celedonio Savage, MD 06/16/2022 12:43 PM

## 2022-06-19 NOTE — L&D Delivery Note (Signed)
OB[facility]N [facility] Delivery Note  Kelsey Cross is a 27 y.o. G1P0 s/p vag del at [redacted]w[redacted]d. She was admitted for for IOL due to PROM.   ROM: 49h 42m with clear fluid, until delivery when thick MSF noted with cord/placenta discoloration GBS Status: neg Maximum Maternal Temperature: 102.9 at 1538 on 07/19/22  Labor Progress: Ms Maltz presented from the office with dx of PROM on 0300 07/18/22. She had the usual cervical ripening measures overnight followed by Pitocin the following day. She had a dx of Triple I and received a dose of Gent and a course of Amp. Her time in active labor was minimal, progressing from 5cm to complete, and she pushed well to vag del x 20mins.  Delivery Date/Time: February 1st, 2024 at 0449 Delivery: Called to room and patient was complete and pushing. Head delivered LOA. No nuchal cord present. Shoulder and body delivered in usual fashion. Infant with spontaneous cry, placed on mother's abdomen, dried and stimulated. Of note, fluid had previously been noted to be clear, however upon delivery there was thick MSF noted including placental discoloration. Cord clamped x 2 after 1-minute delay, and cut by FOB. Cord blood drawn. Placenta delivered spontaneously with gentle cord traction. Fundus initially boggy, so cytotec 455mcg PR and also 438mcg buccal were given with good results (shortly after the oral dose was given she vomited). Labia, perineum, vagina, and cervix inspected and found to be intact.   Placenta: spont, intact; to path Complications: prolonged ROM x 50h; Triple I Lacerations: none EBL: 308cc Analgesia: epidural  Postpartum Planning [x]  message to sent to schedule follow-up   Infant: boy  APGARs 7/8  2760g (6lb 1.4oz)  Myrtis Ser, CNM  07/20/2022 5:14 AM

## 2022-06-20 LAB — CERVICOVAGINAL ANCILLARY ONLY
Bacterial Vaginitis (gardnerella): NEGATIVE
Candida Glabrata: NEGATIVE
Candida Vaginitis: POSITIVE — AB
Chlamydia: NEGATIVE
Comment: NEGATIVE
Comment: NEGATIVE
Comment: NEGATIVE
Comment: NEGATIVE
Comment: NEGATIVE
Comment: NORMAL
Neisseria Gonorrhea: NEGATIVE
Trichomonas: NEGATIVE

## 2022-06-22 ENCOUNTER — Ambulatory Visit: Payer: Medicaid Other | Attending: Obstetrics

## 2022-06-22 ENCOUNTER — Ambulatory Visit: Payer: Medicaid Other | Admitting: *Deleted

## 2022-06-22 VITALS — BP 133/72 | HR 94

## 2022-06-22 DIAGNOSIS — O280 Abnormal hematological finding on antenatal screening of mother: Secondary | ICD-10-CM | POA: Diagnosis not present

## 2022-06-22 DIAGNOSIS — O285 Abnormal chromosomal and genetic finding on antenatal screening of mother: Secondary | ICD-10-CM | POA: Diagnosis present

## 2022-06-22 DIAGNOSIS — Z3A34 34 weeks gestation of pregnancy: Secondary | ICD-10-CM

## 2022-06-22 DIAGNOSIS — O99213 Obesity complicating pregnancy, third trimester: Secondary | ICD-10-CM | POA: Insufficient documentation

## 2022-06-22 DIAGNOSIS — E669 Obesity, unspecified: Secondary | ICD-10-CM

## 2022-06-23 ENCOUNTER — Telehealth: Payer: Self-pay | Admitting: Family Medicine

## 2022-06-23 MED ORDER — CLOTRIMAZOLE 1 % EX CREA: 1.0000 | TOPICAL_CREAM | Freq: Two times a day (BID) | CUTANEOUS | 0 refills | Status: DC

## 2022-06-23 NOTE — Telephone Encounter (Signed)
Patient with positive self swab for yeast infection.  Medication sent to patient's pharmacy.  Discussed results with the patient.

## 2022-06-29 ENCOUNTER — Ambulatory Visit: Payer: Medicaid Other

## 2022-06-30 ENCOUNTER — Ambulatory Visit (INDEPENDENT_AMBULATORY_CARE_PROVIDER_SITE_OTHER): Payer: Medicaid Other | Admitting: Advanced Practice Midwife

## 2022-06-30 VITALS — BP 138/86 | HR 101 | Wt 242.0 lb

## 2022-06-30 DIAGNOSIS — Z2839 Other underimmunization status: Secondary | ICD-10-CM

## 2022-06-30 DIAGNOSIS — Z3403 Encounter for supervision of normal first pregnancy, third trimester: Secondary | ICD-10-CM

## 2022-06-30 DIAGNOSIS — Z3A35 35 weeks gestation of pregnancy: Secondary | ICD-10-CM

## 2022-06-30 DIAGNOSIS — O09899 Supervision of other high risk pregnancies, unspecified trimester: Secondary | ICD-10-CM

## 2022-06-30 NOTE — Progress Notes (Signed)
   LOW-RISK PREGNANCY VISIT Patient name: Kelsey Cross MRN [id number]  Date of birth: 1995/08/12 Chief Complaint:   Routine Prenatal Visit  History of Present Illness:   Kelsey Cross is a 27 y.o. G1P0 female at [redacted]w[redacted]d with an Estimated Date of Delivery: 08/02/22 being seen today for ongoing management of a low-risk pregnancy.  Today she reports  having a dry cough . Contractions: Not present. Vag. Bleeding: None.  Movement: Present. denies leaking of fluid. Review of Systems:   Pertinent items are noted in HPI Denies abnormal vaginal discharge w/ itching/odor/irritation, headaches, visual changes, shortness of breath, chest pain, abdominal pain, severe nausea/vomiting, or problems with urination or bowel movements unless otherwise stated above. Pertinent History Reviewed:  Reviewed past medical,surgical, social, obstetrical and family history.  Reviewed problem list, medications and allergies. Physical Assessment:   Vitals:   06/30/22 1044  BP: 138/86  Pulse: (!) 101  Weight: 242 lb (109.8 kg)  Body mass index is 42.2 kg/m.        Physical Examination:   General appearance: Well appearing, and in no distress  Mental status: Alert, oriented to person, place, and time  Skin: Warm & dry  Cardiovascular: Normal heart rate noted  Respiratory: Normal respiratory effort, no distress  Abdomen: Soft, gravid, nontender  Pelvic: Cervical exam deferred         Extremities: Edema: None  Fetal Status: Fetal Heart Rate (bpm): 148 Fundal Height: 35 cm Movement: Present    No results found for this or any previous visit (from the past 24 hour(s)).  Assessment & Plan:  1) Low-risk pregnancy G1P0 at [redacted]w[redacted]d with an Estimated Date of Delivery: 08/02/22   2) Borderline BP today, rev'd s/s pre-e; doesn't have BP cuff  3) Cough symptoms, will get BabyRx link for preg safe meds, rev'd supportive care for cough/cold   Meds: No orders of the defined types were placed in this  encounter.  Labs/procedures today: none  Plan:  Continue routine obstetrical care   Reviewed: Preterm labor symptoms and general obstetric precautions including but not limited to vaginal bleeding, contractions, leaking of fluid and fetal movement were reviewed in detail with the patient.  All questions were answered. Doesn't have home bp cuff.    Follow-up: Return for As scheduled (GBS/cultures w next visit).  No orders of the defined types were placed in this encounter.  Myrtis Ser CNM 06/30/2022 10:58 AM

## 2022-07-07 ENCOUNTER — Ambulatory Visit: Payer: Medicaid Other | Admitting: Obstetrics & Gynecology

## 2022-07-07 ENCOUNTER — Encounter: Payer: Self-pay | Admitting: Obstetrics & Gynecology

## 2022-07-07 ENCOUNTER — Other Ambulatory Visit (HOSPITAL_COMMUNITY)
Admission: RE | Admit: 2022-07-07 | Discharge: 2022-07-07 | Disposition: A | Discharge status: Home / Self Care | Payer: Medicaid Other | Source: Ambulatory Visit | Attending: Obstetrics & Gynecology | Admitting: Obstetrics & Gynecology

## 2022-07-07 VITALS — BP 129/91 | HR 83 | Wt 244.0 lb

## 2022-07-07 DIAGNOSIS — Z3493 Encounter for supervision of normal pregnancy, unspecified, third trimester: Secondary | ICD-10-CM | POA: Diagnosis not present

## 2022-07-07 DIAGNOSIS — Z3A36 36 weeks gestation of pregnancy: Secondary | ICD-10-CM | POA: Insufficient documentation

## 2022-07-07 DIAGNOSIS — Z3403 Encounter for supervision of normal first pregnancy, third trimester: Secondary | ICD-10-CM

## 2022-07-07 DIAGNOSIS — O163 Unspecified maternal hypertension, third trimester: Secondary | ICD-10-CM

## 2022-07-07 NOTE — Progress Notes (Unsigned)
   LOW-RISK PREGNANCY VISIT Patient name: Kelsey Cross MRN [id number]  Date of birth: [birth year] Chief Complaint:   Routine Prenatal Visit (culture)  History of Present Illness:   Kelsey Cross is a 27 y.o. G1P0 female at [redacted]w[redacted]d with an Estimated Date of Delivery: 08/02/22 being seen today for ongoing management of a low-risk pregnancy.     01/27/2022   11:44 AM 04/03/2016   12:25 PM 04/03/2016   12:19 PM  Depression screen PHQ 2/9  Decreased Interest 0 0 0  Down, Depressed, Hopeless 0 0 0  PHQ - 2 Score 0 0 0  Altered sleeping 0 0   Tired, decreased energy 1 0   Change in appetite 1 0   Feeling bad or failure about yourself  0 0   Trouble concentrating 0 0   Moving slowly or fidgety/restless 0 0   Suicidal thoughts 0 0   PHQ-9 Score 2 0     Today she reports {sx:14538}. Contractions: Not present.  .  Movement: Present. {Actions; denies-reports:120008} leaking of fluid. Review of Systems:   Pertinent items are noted in HPI Denies abnormal vaginal discharge w/ itching/odor/irritation, headaches, visual changes, shortness of breath, chest pain, abdominal pain, severe nausea/vomiting, or problems with urination or bowel movements unless otherwise stated above. Pertinent History Reviewed:  Reviewed past medical,surgical, social, obstetrical and family history.  Reviewed problem list, medications and allergies. Physical Assessment:   Vitals:   07/07/22 1229 07/07/22 1307  BP: (!) 130/92 (!) 129/91  Pulse: 79 83  Weight: 244 lb (110.7 kg)   Body mass index is 42.54 kg/m.        Physical Examination:   General appearance: Well appearing, and in no distress  Mental status: Alert, oriented to person, place, and time  Skin: Warm & dry  Cardiovascular: Normal heart rate noted  Respiratory: Normal respiratory effort, no distress  Abdomen: Soft, gravid, nontender  Pelvic: {Blank single:19197::"Cervical exam performed","Cervical exam deferred"}         Extremities: Edema:  None  Fetal Status:     Movement: Present    Chaperone: {Blank single:19197::"n/a","Amanda Rash","Janet Young","Latisha Cresenzo","Peggy Dones","Sabrina Holland"}    No results found for this or any previous visit (from the past 24 hour(s)).  Assessment & Plan:  1) Low-risk pregnancy G1P0 at [redacted]w[redacted]d with an Estimated Date of Delivery: 08/02/22   2) ***, ***   Meds: No orders of the defined types were placed in this encounter.  Labs/procedures today: ***  Plan:  Continue routine obstetrical care *** Next visit: prefers {Blank single:19197::"in person","online","will be in person for ***"}    Reviewed: {Blank single:19197::"Term","Preterm"} labor symptoms and general obstetric precautions including but not limited to vaginal bleeding, contractions, leaking of fluid and fetal movement were reviewed in detail with the patient.  All questions were answered. *** home bp cuff. Rx faxed to ***. Check bp weekly, let us know if >140/90.   Follow-up: Return in about 4 days (around 07/11/2022) for Nurse only: BP check.  Orders Placed This Encounter  Procedures   Culture, beta strep (group b only)    Florian Buff, MD 07/07/2022 1:46 PM

## 2022-07-10 LAB — CERVICOVAGINAL ANCILLARY ONLY
Chlamydia: NEGATIVE
Comment: NEGATIVE
Comment: NORMAL
Neisseria Gonorrhea: NEGATIVE

## 2022-07-11 ENCOUNTER — Ambulatory Visit (INDEPENDENT_AMBULATORY_CARE_PROVIDER_SITE_OTHER): Payer: Medicaid Other | Admitting: *Deleted

## 2022-07-11 DIAGNOSIS — O285 Abnormal chromosomal and genetic finding on antenatal screening of mother: Secondary | ICD-10-CM

## 2022-07-11 DIAGNOSIS — Z3403 Encounter for supervision of normal first pregnancy, third trimester: Secondary | ICD-10-CM

## 2022-07-11 DIAGNOSIS — O09899 Supervision of other high risk pregnancies, unspecified trimester: Secondary | ICD-10-CM

## 2022-07-11 LAB — CULTURE, BETA STREP (GROUP B ONLY): Strep Gp B Culture: NEGATIVE

## 2022-07-11 NOTE — Progress Notes (Signed)
   NURSE VISIT- BLOOD PRESSURE CHECK  SUBJECTIVE:  Kelsey Cross is a 27 y.o. G1P0 female here for BP check. She is [redacted]w[redacted]d pregnant    HYPERTENSION ROS:  Pregnant:  Severe headaches that don't go away with tylenol/other medicines: No  Visual changes (seeing spots/double/blurred vision) No  Severe pain under right breast breast or in center of upper chest No  Severe nausea/vomiting No  Taking medicines as instructed not applicable   OBJECTIVE:  LMP 10/01/2021   Appearance alert, well appearing, and in no distress.  ASSESSMENT: Pregnancy [redacted]w[redacted]d  blood pressure check  PLAN: Discussed with Dr. Elonda Husky   Recommendations: no changes needed   Follow-up: as scheduled   Alice Rieger  07/11/2022 4:18 PM

## 2022-07-14 ENCOUNTER — Ambulatory Visit (INDEPENDENT_AMBULATORY_CARE_PROVIDER_SITE_OTHER): Payer: Medicaid Other | Admitting: Obstetrics & Gynecology

## 2022-07-14 VITALS — BP 121/82 | HR 101 | Wt 245.0 lb

## 2022-07-14 DIAGNOSIS — Z3A37 37 weeks gestation of pregnancy: Secondary | ICD-10-CM

## 2022-07-14 DIAGNOSIS — Z3403 Encounter for supervision of normal first pregnancy, third trimester: Secondary | ICD-10-CM

## 2022-07-14 NOTE — Progress Notes (Signed)
   LOW-RISK PREGNANCY VISIT Patient name: Kelsey Cross MRN [id number]  Date of birth: 10-21-1995 Chief Complaint:   Routine Prenatal Visit  History of Present Illness:   Kelsey Cross is a 27 y.o. G1P0 female at [redacted]w[redacted]d with an Estimated Date of Delivery: 08/02/22 being seen today for ongoing management of a low-risk pregnancy.     01/27/2022   11:44 AM 04/03/2016   12:25 PM 04/03/2016   12:19 PM  Depression screen PHQ 2/9  Decreased Interest 0 0 0  Down, Depressed, Hopeless 0 0 0  PHQ - 2 Score 0 0 0  Altered sleeping 0 0   Tired, decreased energy 1 0   Change in appetite 1 0   Feeling bad or failure about yourself  0 0   Trouble concentrating 0 0   Moving slowly or fidgety/restless 0 0   Suicidal thoughts 0 0   PHQ-9 Score 2 0     Today she reports no complaints. Contractions: Not present. Vag. Bleeding: None.  Movement: Present. denies leaking of fluid. Review of Systems:   Pertinent items are noted in HPI Denies abnormal vaginal discharge w/ itching/odor/irritation, headaches, visual changes, shortness of breath, chest pain, abdominal pain, severe nausea/vomiting, or problems with urination or bowel movements unless otherwise stated above. Pertinent History Reviewed:  Reviewed past medical,surgical, social, obstetrical and family history.  Reviewed problem list, medications and allergies. Physical Assessment:   Vitals:   07/14/22 1128  BP: 121/82  Pulse: (!) 101  Weight: 245 lb (111.1 kg)  Body mass index is 42.72 kg/m.        Physical Examination:   General appearance: Well appearing, and in no distress  Mental status: Alert, oriented to person, place, and time  Skin: Warm & dry  Cardiovascular: Normal heart rate noted  Respiratory: Normal respiratory effort, no distress  Abdomen: Soft, gravid, nontender  Pelvic: Cervical exam deferred         Extremities: Edema: None  Fetal Status:     Movement: Present    Chaperone: n/a    No results found for this or any  previous visit (from the past 24 hour(s)).  Assessment & Plan:  1) Low-risk pregnancy G1P0 at [redacted]w[redacted]d with an Estimated Date of Delivery: 08/02/22      Meds: No orders of the defined types were placed in this encounter.  Labs/procedures today:   Plan:  Continue routine obstetrical care  Next visit: prefers in person      Follow-up: Return in about 1 week (around 07/21/2022) for [name].  No orders of the defined types were placed in this encounter.   Florian Buff, MD 07/14/2022 12:01 PM

## 2022-07-18 ENCOUNTER — Inpatient Hospital Stay (HOSPITAL_COMMUNITY)
Admission: AD | Admit: 2022-07-18 | Discharge: 2022-07-22 | DRG: 805 | Disposition: A | Discharge status: Home / Self Care | Payer: Medicaid Other | Attending: Obstetrics and Gynecology | Admitting: Obstetrics and Gynecology

## 2022-07-18 ENCOUNTER — Encounter (HOSPITAL_COMMUNITY): Payer: Self-pay | Admitting: Family Medicine

## 2022-07-18 ENCOUNTER — Ambulatory Visit (INDEPENDENT_AMBULATORY_CARE_PROVIDER_SITE_OTHER): Payer: Medicaid Other | Admitting: Obstetrics & Gynecology

## 2022-07-18 ENCOUNTER — Other Ambulatory Visit: Payer: Self-pay

## 2022-07-18 VITALS — BP 115/87 | HR 83 | Wt 246.0 lb

## 2022-07-18 DIAGNOSIS — O411230 Chorioamnionitis, third trimester, not applicable or unspecified: Secondary | ICD-10-CM | POA: Diagnosis present

## 2022-07-18 DIAGNOSIS — O4290 Premature rupture of membranes, unspecified as to length of time between rupture and onset of labor, unspecified weeks of gestation: Secondary | ICD-10-CM

## 2022-07-18 DIAGNOSIS — Z3A37 37 weeks gestation of pregnancy: Secondary | ICD-10-CM

## 2022-07-18 DIAGNOSIS — O4292 Full-term premature rupture of membranes, unspecified as to length of time between rupture and onset of labor: Principal | ICD-10-CM | POA: Diagnosis present

## 2022-07-18 DIAGNOSIS — O4202 Full-term premature rupture of membranes, onset of labor within 24 hours of rupture: Secondary | ICD-10-CM | POA: Diagnosis not present

## 2022-07-18 DIAGNOSIS — O99214 Obesity complicating childbirth: Secondary | ICD-10-CM | POA: Diagnosis present

## 2022-07-18 DIAGNOSIS — O4210 Premature rupture of membranes, onset of labor more than 24 hours following rupture, unspecified weeks of gestation: Secondary | ICD-10-CM | POA: Diagnosis not present

## 2022-07-18 DIAGNOSIS — Z2839 Other underimmunization status: Secondary | ICD-10-CM

## 2022-07-18 DIAGNOSIS — Z23 Encounter for immunization: Secondary | ICD-10-CM

## 2022-07-18 DIAGNOSIS — Z3400 Encounter for supervision of normal first pregnancy, unspecified trimester: Secondary | ICD-10-CM

## 2022-07-18 DIAGNOSIS — Z3403 Encounter for supervision of normal first pregnancy, third trimester: Secondary | ICD-10-CM

## 2022-07-18 DIAGNOSIS — O98919 Unspecified maternal infectious and parasitic disease complicating pregnancy, unspecified trimester: Secondary | ICD-10-CM | POA: Diagnosis not present

## 2022-07-18 LAB — CBC
HCT: 36.3 % (ref 36.0–46.0)
Hemoglobin: 12.2 g/dL (ref 12.0–15.0)
MCH: 29.1 pg (ref 26.0–34.0)
MCHC: 33.6 g/dL (ref 30.0–36.0)
MCV: 86.6 fL (ref 80.0–100.0)
Platelets: 302 10*3/uL (ref 150–400)
RBC: 4.19 MIL/uL (ref 3.87–5.11)
RDW: 13.8 % (ref 11.5–15.5)
WBC: 12.4 10*3/uL — ABNORMAL HIGH (ref 4.0–10.5)
nRBC: 0 % (ref 0.0–0.2)

## 2022-07-18 LAB — TYPE AND SCREEN
ABO/RH(D): O POS
Antibody Screen: NEGATIVE

## 2022-07-18 MED ORDER — OXYCODONE-ACETAMINOPHEN 5-325 MG PO TABS: 2.0000 | ORAL_TABLET | ORAL | Status: DC | PRN

## 2022-07-18 MED ORDER — SOD CITRATE-CITRIC ACID 500-334 MG/5ML PO SOLN: 30.0000 mL | ORAL | Status: DC | PRN

## 2022-07-18 MED ORDER — MISOPROSTOL 25 MCG QUARTER TABLET
25.0000 ug | ORAL_TABLET | Freq: Once | ORAL | Status: AC
  Administered 2022-07-18: 25 ug via ORAL
  Filled 2022-07-18: qty 1

## 2022-07-18 MED ORDER — MISOPROSTOL 25 MCG QUARTER TABLET
25.0000 ug | ORAL_TABLET | Freq: Once | ORAL | Status: AC
  Administered 2022-07-18: 25 ug via VAGINAL
  Filled 2022-07-18: qty 1

## 2022-07-18 MED ORDER — ONDANSETRON HCL 4 MG/2ML IJ SOLN
4.0000 mg | Freq: Four times a day (QID) | INTRAMUSCULAR | Status: DC | PRN
  Administered 2022-07-20: 4 mg via INTRAVENOUS
  Filled 2022-07-18: qty 2

## 2022-07-18 MED ORDER — TERBUTALINE SULFATE 1 MG/ML IJ SOLN: 0.2500 mg | Freq: Once | INTRAMUSCULAR | Status: DC | PRN

## 2022-07-18 MED ORDER — FENTANYL CITRATE (PF) 100 MCG/2ML IJ SOLN
100.0000 ug | INTRAMUSCULAR | Status: DC | PRN
  Administered 2022-07-18 – 2022-07-19 (×4): 100 ug via INTRAVENOUS
  Filled 2022-07-18 (×4): qty 2

## 2022-07-18 MED ORDER — LACTATED RINGERS IV SOLN: INTRAVENOUS | Status: DC

## 2022-07-18 MED ORDER — LACTATED RINGERS IV SOLN
500.0000 mL | INTRAVENOUS | Status: DC | PRN
  Administered 2022-07-19: 500 mL via INTRAVENOUS

## 2022-07-18 MED ORDER — OXYCODONE-ACETAMINOPHEN 5-325 MG PO TABS: 1.0000 | ORAL_TABLET | ORAL | Status: DC | PRN

## 2022-07-18 MED ORDER — OXYTOCIN-SODIUM CHLORIDE 30-0.9 UT/500ML-% IV SOLN: 2.5000 [IU]/h | INTRAVENOUS | Status: DC

## 2022-07-18 MED ORDER — OXYTOCIN BOLUS FROM INFUSION
333.0000 mL | Freq: Once | INTRAVENOUS | Status: AC
  Administered 2022-07-20: 333 mL via INTRAVENOUS

## 2022-07-18 MED ORDER — LIDOCAINE HCL (PF) 1 % IJ SOLN: 30.0000 mL | INTRAMUSCULAR | Status: DC | PRN

## 2022-07-18 MED ORDER — ACETAMINOPHEN 325 MG PO TABS: 650.0000 mg | ORAL_TABLET | ORAL | Status: DC | PRN

## 2022-07-18 NOTE — Progress Notes (Signed)
Patient ID: Kelsey Cross, female   DOB: 03/01/1996, 27 y.o.   MRN: [id number] Late Entry, patient seen at 2030  Requesting IV pain medication  Vitals:   07/18/22 1715 07/18/22 1812 07/18/22 1929 07/18/22 2155  BP: 129/82 126/82 136/89 (!) 150/86  Pulse: 88 74 83 89  Resp: 16 18  18   Temp:    98 F (36.7 C)  TempSrc:    Oral  Weight:      Height:       FHR stable UCs regular and frequent  Foley came out at 2100  Dilation: 4 Effacement (%): 50 Station: -3 Presentation: Vertex Exam by:: Doloris Hall, RN

## 2022-07-18 NOTE — H&P (Cosign Needed Addendum)
OBSTETRIC ADMISSION HISTORY AND PHYSICAL  Kelsey Cross is a 27 y.o. female G1P0 with IUP at [redacted]w[redacted]d by 10 wk [place] presenting for PROM. She reports +FMs, No LOF, no VB, no blurry vision, headaches or peripheral edema, and RUQ pain.  She plans on breast feeding. She request POPs for birth control. She received her prenatal care at [facility]   Dating: By 10wk [place] --->  Estimated Date of Delivery: 08/02/22  Sono:    @[redacted]w[redacted]d , CWD, normal anatomy, cephalic presentation, 3664Q, 77% EFW   Prenatal History/Complications: rubella non-immune, chlamydia and trich in pregnancy with TOC, High risk for T18/T13 d/t low fetal fraction on NIPS, increased r/f T18 on IT, repeat NIPS insufficient fetal DNA x 2, has seen MFM, declined amnio. Normal u/s. Doing f/u growths w/ them   Past Medical History: Past Medical History:  Diagnosis Date   Medical history non-contributory     Past Surgical History: Past Surgical History:  Procedure Laterality Date   NO PAST SURGERIES      Obstetrical History: OB History     Gravida  1   Para      Term      Preterm      AB      Living         SAB      IAB      Ectopic      Multiple      Live Births              Social History Social History   Socioeconomic History   Marital status: Single    Spouse name: Not on file   Number of children: Not on file   Years of education: Not on file   Highest education level: Not on file  Occupational History   Not on file  Tobacco Use   Smoking status: Never   Smokeless tobacco: Never  Vaping Use   Vaping Use: Never used  Substance and Sexual Activity   Alcohol use: No   Drug use: No   Sexual activity: Yes    Birth control/protection: None  Other Topics Concern   Not on file  Social History Narrative   Not on file   Social Determinants of Health   Financial Resource Strain: Low Risk  (01/27/2022)   Overall Financial Resource Strain (CARDIA)    Difficulty of Paying Living Expenses: Not  hard at all  Food Insecurity: No Food Insecurity (01/27/2022)   Hunger Vital Sign    Worried About Running Out of Food in the Last Year: Never true    Ran Out of Food in the Last Year: Never true  Transportation Needs: No Transportation Needs (01/27/2022)   PRAPARE - Hydrologist (Medical): No    Lack of Transportation (Non-Medical): No  Physical Activity: Insufficiently Active (01/27/2022)   Exercise Vital Sign    Days of Exercise per Week: 3 days    Minutes of Exercise per Session: 20 min  Stress: No Stress Concern Present (01/27/2022)   [place]    Feeling of Stress : Only a little  Social Connections: Moderately Integrated (01/27/2022)   Social Connection and Isolation Panel [NHANES]    Frequency of Communication with Friends and Family: Twice a week    Frequency of Social Gatherings with Friends and Family: Twice a week    Attends Religious Services: 1 to 4 times per year    Active Member  of Clubs or Organizations: No    Attends Archivist Meetings: Never    Marital Status: Living with partner    Family History: Family History  Problem Relation Age of Onset   Diabetes Father    Diabetes Paternal Grandfather     Allergies: No Known Allergies  Medications Prior to Admission  Medication Sig Dispense Refill Last Dose   Blood Pressure Monitor MISC For regular home bp monitoring during pregnancy (Patient not taking: Reported on 05/17/2022) 1 each 0    Prenatal Vit-Fe Fumarate-FA (PRENATAL VITAMIN PO) Take by mouth.        Review of Systems   All systems reviewed and negative except as stated in HPI  Last menstrual period 10/01/2021. General appearance: alert, cooperative, and appears stated age Lungs: clear to auscultation bilaterally Heart: regular rate and rhythm Abdomen: soft, non-tender; bowel sounds normal Extremities: Homans sign is negative, no sign of  DVT Presentation: cephalic noted during office checj Fetal monitoringBaseline: 130 bpm, Variability: Good {> 6 bpm), Accelerations: Reactive, and Decelerations: Absent Uterine activityNone     Prenatal labs: ABO, Rh: O/Positive/-- (08/11 1222) Antibody: Negative (11/20 0832) Rubella: 0.95 (08/11 1222) RPR: Non Reactive (11/20 0832)  HBsAg: Negative (08/11 1222)  HIV: Non Reactive (11/20 0832)  GBS: Negative/-- (01/19 1400)  2 hr Glucola Nml Genetic screening  nml Anatomy US nml  Prenatal Transfer Tool  Maternal Diabetes: No Genetic Screening: Normal Maternal Ultrasounds/Referrals: Normal Fetal Ultrasounds or other Referrals:  None Maternal Substance Abuse:  No Significant Maternal Medications:  None Significant Maternal Lab Results:  Group B Strep negative Number of Prenatal Visits:greater than 3 verified prenatal visits Other Comments:  None  No results found for this or any previous visit (from the past 24 hour(s)).  Patient Active Problem List   Diagnosis Date Noted   PROM (premature rupture of membranes) 07/18/2022   Premature rupture of membranes 07/18/2022   Rubella non-immune status, antepartum 06/30/2022   Abnormal Pap smear of cervix 04/24/2022   Abnormal genetic test during pregnancy 03/30/2022   Supervision of normal first pregnancy 12/26/2021   Vitamin D deficiency 04/04/2016   Obesity (BMI 35.0-39.9 without comorbidity) 04/03/2016    Assessment/Plan:  Kelsey Cross is a 27 y.o. G1P0 at 102w6d here for PROM. Was seen at the office PTA and confirmed to be ruptured, was checked at that time and was 1/thick/-3.   #Labor: cytotec 50/61mcg PO/VAG #Pain: Would like to avoid an epidural, but is considering one if needed #FWB: Cat I #ID:  GBS neg #MOF: Breast #MOC: POPs #Circ:  no  [name], MD  07/18/2022, 3:44 PM ___ GME ATTESTATION:  Evaluation and management procedures were performed by the [facility] Medicine Resident under my supervision. I was  immediately available for direct supervision, assistance and direction throughout this encounter.  I also confirm that I have verified the information documented in the resident's note, and that I have also personally reperformed the pertinent components of the physical exam and all of the medical decision making activities.  I have also made any necessary editorial changes.  Shelda Pal, DO OB Fellow, [place] [facility] [place] 07/18/2022 4:17 PM

## 2022-07-18 NOTE — Progress Notes (Signed)
   LOW-RISK PREGNANCY VISIT Patient name: Kelsey Cross MRN [id number]  Date of birth: 08/24/1995 Chief Complaint:   Routine Prenatal Visit (Had leaking last night)  History of Present Illness:   Kelsey Cross is a 27 y.o. G1P0 female at [redacted]w[redacted]d with an Estimated Date of Delivery: 08/02/22 being seen today for ongoing management of a low-risk pregnancy.     01/27/2022   11:44 AM 04/03/2016   12:25 PM 04/03/2016   12:19 PM  Depression screen PHQ 2/9  Decreased Interest 0 0 0  Down, Depressed, Hopeless 0 0 0  PHQ - 2 Score 0 0 0  Altered sleeping 0 0   Tired, decreased energy 1 0   Change in appetite 1 0   Feeling bad or failure about yourself  0 0   Trouble concentrating 0 0   Moving slowly or fidgety/restless 0 0   Suicidal thoughts 0 0   PHQ-9 Score 2 0     Today she reports leakage of fluid, around 3 am, no contractions. Contractions: Not present. Vag. Bleeding: None.  Movement: (!) Decreased. reports leaking of fluid. Review of Systems:   Pertinent items are noted in HPI Denies abnormal vaginal discharge w/ itching/odor/irritation, headaches, visual changes, shortness of breath, chest pain, abdominal pain, severe nausea/vomiting, or problems with urination or bowel movements unless otherwise stated above. Pertinent History Reviewed:  Reviewed past medical,surgical, social, obstetrical and family history.  Reviewed problem list, medications and allergies. Physical Assessment:   Vitals:   07/18/22 1120  BP: 115/87  Pulse: 83  Weight: 246 lb (111.6 kg)  Body mass index is 42.89 kg/m.        Physical Examination:   General appearance: Well appearing, and in no distress  Mental status: Alert, oriented to person, place, and time  Skin: Warm & dry  Cardiovascular: Normal heart rate noted  Respiratory: Normal respiratory effort, no distress  Abdomen: Soft, gravid, nontender  Pelvic: Ft/th/-3 +pooling  +fern         Extremities:    Fetal Status:     Movement: (!) Decreased     Reactive NST Cleona Doubleday is at [redacted]w[redacted]d Estimated Date of Delivery: 08/02/22  NST being performed due to decreased FM + ?ROM  Today the NST is Reactive  Fetal Monitoring:  Baseline: 130 bpm, Variability: Good {> 6 bpm), Accelerations: Reactive, and Decelerations: Absent   reactive  The accelerations are >15 bpm and more than 2 in 20 minutes  Final diagnosis:  Reactive NST  Florian Buff, MD    Chaperone: Estill Bamberg Rash    No results found for this or any previous visit (from the past 24 hour(s)).  Assessment & Plan:  1) Low-risk pregnancy G1P0 at [redacted]w[redacted]d with an Estimated Date of Delivery: 08/02/22   2) SROM 0300 07/18/22 no labor,    Meds: No orders of the defined types were placed in this encounter.  Labs/procedures today: NST  Plan:  Going to L&D for admission, Dr Nehemiah Settle made aware Next visit: prefers       Follow-up: Return in about 6 weeks (around 08/29/2022) for post partum visit.  No orders of the defined types were placed in this encounter.   Florian Buff, MD 07/18/2022 12:09 PM

## 2022-07-18 NOTE — Progress Notes (Signed)
Kelsey Cross is a 27 y.o. G1P0 at [redacted]w[redacted]d by ultrasound admitted for PROM  Subjective:  Pt feeling well, not feeling much in terms of cramping.    Objective: BP 136/89   Pulse 83   Temp 98.1 F (36.7 C) (Oral)   Resp 18   Ht 5\' 3"  (1.6 m)   Wt 111.6 kg   LMP 10/01/2021   BMI 43.58 kg/m  No intake/output data recorded. No intake/output data recorded.  FHT:  FHR: 130 bpm, variability: moderate,  accelerations:  Present,  decelerations:  Absent UC:   regular, every 3 minutes SVE:   Dilation: 1.5 Effacement (%): 40 Station: -3 Exam by:: Dr. Elonda Husky (in office)  Labs: Lab Results  Component Value Date   WBC 12.4 (H) 07/18/2022   HGB 12.2 07/18/2022   HCT 36.3 07/18/2022   MCV 86.6 07/18/2022   PLT 302 07/18/2022    Assessment / Plan: PROM, s/p cytotec x2. FB placed with this last check will also give another dose of cytotec. Will consider starting pit once the FB falls out.   Labor: latent Fetal Wellbeing:  Category I Pain Control:  Labor support without medications, may consider  I/D:  GBS- Anticipated MOD:  NSVD  [name], MD 07/18/2022, 8:01 PM

## 2022-07-19 ENCOUNTER — Inpatient Hospital Stay (HOSPITAL_COMMUNITY): Payer: Medicaid Other | Admitting: Anesthesiology

## 2022-07-19 DIAGNOSIS — O98919 Unspecified maternal infectious and parasitic disease complicating pregnancy, unspecified trimester: Secondary | ICD-10-CM | POA: Diagnosis not present

## 2022-07-19 LAB — RPR: RPR Ser Ql: NONREACTIVE

## 2022-07-19 MED ORDER — DIPHENHYDRAMINE HCL 50 MG/ML IJ SOLN: 12.5000 mg | INTRAMUSCULAR | Status: DC | PRN

## 2022-07-19 MED ORDER — OXYTOCIN-SODIUM CHLORIDE 30-0.9 UT/500ML-% IV SOLN
1.0000 m[IU]/min | INTRAVENOUS | Status: DC
  Administered 2022-07-19: 2 m[IU]/min via INTRAVENOUS
  Filled 2022-07-19: qty 500

## 2022-07-19 MED ORDER — ACETAMINOPHEN 10 MG/ML IV SOLN
1000.0000 mg | Freq: Four times a day (QID) | INTRAVENOUS | Status: DC
  Administered 2022-07-19 – 2022-07-20 (×2): 1000 mg via INTRAVENOUS
  Filled 2022-07-19 (×3): qty 100

## 2022-07-19 MED ORDER — SODIUM CHLORIDE 0.9 % IV SOLN
2.0000 g | Freq: Four times a day (QID) | INTRAVENOUS | Status: DC
  Administered 2022-07-19 – 2022-07-20 (×3): 2 g via INTRAVENOUS
  Filled 2022-07-19 (×3): qty 2000

## 2022-07-19 MED ORDER — PHENYLEPHRINE 80 MCG/ML (10ML) SYRINGE FOR IV PUSH (FOR BLOOD PRESSURE SUPPORT): 80.0000 ug | PREFILLED_SYRINGE | INTRAVENOUS | Status: DC | PRN

## 2022-07-19 MED ORDER — LACTATED RINGERS IV SOLN: 500.0000 mL | Freq: Once | INTRAVENOUS | Status: DC

## 2022-07-19 MED ORDER — FENTANYL-BUPIVACAINE-NACL 0.5-0.125-0.9 MG/250ML-% EP SOLN
12.0000 mL/h | EPIDURAL | Status: DC | PRN
  Administered 2022-07-19: 12 mL/h via EPIDURAL
  Filled 2022-07-19: qty 250

## 2022-07-19 MED ORDER — EPHEDRINE 5 MG/ML INJ: 10.0000 mg | INTRAVENOUS | Status: DC | PRN

## 2022-07-19 MED ORDER — LACTATED RINGERS IV SOLN
500.0000 mL | Freq: Once | INTRAVENOUS | Status: AC
  Administered 2022-07-19: 500 mL via INTRAVENOUS

## 2022-07-19 MED ORDER — LIDOCAINE HCL (PF) 1 % IJ SOLN
INTRAMUSCULAR | Status: DC | PRN
  Administered 2022-07-19: 5 mL via EPIDURAL
  Administered 2022-07-19: 2 mL via EPIDURAL
  Administered 2022-07-19: 3 mL via EPIDURAL

## 2022-07-19 MED ORDER — GENTAMICIN SULFATE 40 MG/ML IJ SOLN
5.0000 mg/kg | INTRAVENOUS | Status: DC
  Administered 2022-07-19: 380 mg via INTRAVENOUS
  Filled 2022-07-19 (×2): qty 9.5

## 2022-07-19 NOTE — Progress Notes (Signed)
Patient ID: Kelsey Cross, female   DOB: 08-03-1995, 27 y.o.   MRN: [id number] I checked her at 0130 and inserted an IUPC since we were not tracing contractions well  FHR stable UCs measure 200MVU/59min  Dilation: 4 Effacement (%): 60 Station: -2 Presentation: Vertex Exam by:: Lelan Pons, CNM  (At 5366)  Will continue to observe

## 2022-07-19 NOTE — Progress Notes (Signed)
Labor Progress Note  Kelsey Cross is a 27 y.o. G1P0 at [redacted]w[redacted]d presented for PROM  S: pt feeling the contractions more strongly.   O:  BP 121/67   Pulse (!) 115   Temp 98.7 F (37.1 C) (Axillary)   Resp 16   Ht 5\' 3"  (1.6 m)   Wt 111.6 kg   LMP 10/01/2021   BMI 43.58 kg/m  EFM: 170 bpm/Mild variability/ no accels/ None decels  CVE: Dilation: 5 Effacement (%): 60 Station: -3 Presentation: Vertex Exam by:: Dr. Clement Husbands   [name]: 70 y.o. G1P0 [redacted]w[redacted]d  here for PROM as above.   GBS neg Cytotec x3 2000 FB in 2000>2100 Pit 0915-now IUPC  #Triple I: rectal temp of 102.9, will start amp and gent and administer IV tylenol.  #Labor:  Continue pitocin 2x2 and titration as necessary.  #Pain: Family/Friend support and PO/IV pain meds, Epidural to be placed soon.  #FWB: CAT 2 tracing, fetal tachycardia improving with tachycardia #GBS negative  [name], MD PGY-1 [facility] 07/19/22  6:10 PM

## 2022-07-19 NOTE — Plan of Care (Signed)

## 2022-07-19 NOTE — Anesthesia Preprocedure Evaluation (Signed)
Anesthesia Evaluation  Patient identified by MRN, date of birth, ID band Patient awake    Reviewed: Allergy & Precautions, NPO status , Patient's Chart, lab work & pertinent test results  Airway Mallampati: III  TM Distance: >3 FB Neck ROM: Full    Dental  (+) Teeth Intact, Dental Advisory Given   Pulmonary neg pulmonary ROS   Pulmonary exam normal breath sounds clear to auscultation       Cardiovascular negative cardio ROS Normal cardiovascular exam Rhythm:Regular Rate:Normal     Neuro/Psych negative neurological ROS     GI/Hepatic negative GI ROS, Neg liver ROS,,,  Endo/Other    Morbid obesity  Renal/GU negative Renal ROS     Musculoskeletal negative musculoskeletal ROS (+)    Abdominal   Peds  Hematology negative hematology ROS (+) Plt 302k   Anesthesia Other Findings Day of surgery medications reviewed with the patient.  Reproductive/Obstetrics                             Anesthesia Physical Anesthesia Plan  ASA: 3  Anesthesia Plan: Epidural   Post-op Pain Management:    Induction:   PONV Risk Score and Plan: 2 and Treatment may vary due to age or medical condition  Airway Management Planned: Natural Airway  Additional Equipment:   Intra-op Plan:   Post-operative Plan:   Informed Consent: I have reviewed the patients History and Physical, chart, labs and discussed the procedure including the risks, benefits and alternatives for the proposed anesthesia with the patient or authorized representative who has indicated his/her understanding and acceptance.     Dental advisory given  Plan Discussed with:   Anesthesia Plan Comments: (Patient identified. Risks/Benefits/Options discussed with patient including but not limited to bleeding, infection, nerve damage, paralysis, failed block, incomplete pain control, headache, blood pressure changes, nausea, vomiting, reactions to  medication both or allergic, itching and postpartum back pain. Confirmed with bedside nurse the patient's most recent platelet count. Confirmed with patient that they are not currently taking any anticoagulation, have any bleeding history or any family history of bleeding disorders. Patient expressed understanding and wished to proceed. All questions were answered. )       Anesthesia Quick Evaluation

## 2022-07-19 NOTE — Progress Notes (Addendum)
Labor Progress Note  Kelsey Cross is a 27 y.o. G1P0 at [redacted]w[redacted]d presented for PROM  S: pt feeling the contractions more strongly.   O:  BP 110/72   Pulse 91   Temp 97.8 F (36.6 C) (Oral)   Resp 16   Ht 5\' 3"  (1.6 m)   Wt 111.6 kg   LMP 10/01/2021   BMI 43.58 kg/m  EFM: 180 bpm/Mild variability/ no accels/ None decels  CVE: Dilation: 5 Effacement (%): 60 Station: -3 Presentation: Vertex Exam by:: Dr. Clement Husbands   [name]: 57 y.o. G1P0 [redacted]w[redacted]d  here for PROM as above.   GBS neg Cytotec x3 2000 FB in 2000>2100 Pit 0915-now IUPC  #Triple I: rectal temp of 102.9, will start amp and gent and administer IV tylenol.  #Labor:  Continue pitocin 2x2 and titration as necessary. Now having adequate contractions #Pain: Family/Friend support and PO/IV pain meds, maybe epidural later #FWB: CAT 2 tracing, likely will improve with abx #GBS negative  [name], MD PGY-1 [facility] 07/19/22  1:40 PM

## 2022-07-19 NOTE — Progress Notes (Signed)
Patient ID: Kelsey Cross, female   DOB: 08-Dec-1995, 27 y.o.   MRN: [id number]  Now comfortable after epidural placement; Amp and Gent dosing for Triple I  BPs 113/64, P low 100s, T 98.7 FHR 150-160s, +accels, occ early variables Ctx q 2-3, MVUs currently 140s, with Pit at 54mu/min Cx 5/70/vtx -3 per RN exam  IUP@38 .0wks ROM ~40hrs Triple I  Continue to increase Pit to keep ctx adequate Hold on cx exam for at least 4h unless having strong symtoms Hopeful for vag delivery  Myrtis Ser CNM 07/19/2022

## 2022-07-19 NOTE — Progress Notes (Signed)
Patient ID: Kelsey Cross, female   DOB: 02-03-1996, 27 y.o.   MRN: [id number]  Appears to be resting  BP 120/79, P 102, T 99.6 FHR 150-160s, +accels, occ early variables Ctx q 3 mins with Pit at 68mu/min; MVUs adequate Cx deferred  IUP@38 .0wks ROM 40+hrs Triple I  Keep ctx adequate and give time for cx change Continue Amp Hopeful for vag del  [place] 07/19/2022 11:43 PM

## 2022-07-19 NOTE — Anesthesia Procedure Notes (Signed)
Epidural Patient location during procedure: OB Start time: 07/19/2022 6:03 PM End time: 07/19/2022 6:10 PM  Staffing Anesthesiologist: Santa Lighter, MD Performed: anesthesiologist   Preanesthetic Checklist Completed: patient identified, IV checked, risks and benefits discussed, monitors and equipment checked, pre-op evaluation and timeout performed  Epidural Patient position: sitting Prep: DuraPrep Patient monitoring: blood pressure and continuous pulse ox Approach: midline Location: L3-L4 Injection technique: LOR air  Needle:  Needle type: Tuohy  Needle gauge: 17 G Needle length: 9 cm Needle insertion depth: 9 cm Catheter size: 19 Gauge Catheter at skin depth: 15 cm Test dose: negative and Other (1% Lidocaine)  Additional Notes Patient identified.  Risk benefits discussed including failed block, incomplete pain control, headache, nerve damage, paralysis, blood pressure changes, nausea, vomiting, reactions to medication both toxic or allergic, and postpartum back pain.  Patient expressed understanding and wished to proceed.  All questions were answered.  Sterile technique used throughout procedure and epidural site dressed with sterile barrier dressing. No paresthesia or other complications noted. The patient did not experience any signs of intravascular injection such as tinnitus or metallic taste in mouth nor signs of intrathecal spread such as rapid motor block. Please see nursing notes for vital signs. Reason for block:procedure for pain

## 2022-07-19 NOTE — Progress Notes (Signed)
Labor Progress Note  Kelsey Cross is a 27 y.o. G1P0 at [redacted]w[redacted]d presented for PROM  S: pt doing well  O:  BP 107/70   Pulse 82   Temp 97.8 F (36.6 C) (Oral)   Resp 16   Ht 5\' 3"  (1.6 m)   Wt 111.6 kg   LMP 10/01/2021   BMI 43.58 kg/m  EFM: 140 bpm/Moderate variability/ 15x15 accels/ None decels  CVE: Dilation: 5 Effacement (%): 60 Station: -3 Presentation: Vertex Exam by:: Dr. Clement Husbands   [name]: 52 y.o. G1P0 [redacted]w[redacted]d  here for PROM as above  #Labor: Progressing well. Pitocin started 2x2 #Pain: Family/Friend support and PO/IV pain meds #FWB: CAT 1 #GBS negative  Shelda Pal, DO [place] Fellow, Faculty practice [place] [facility] [place] 07/19/22  10:24 AM

## 2022-07-20 ENCOUNTER — Encounter (HOSPITAL_COMMUNITY): Payer: Self-pay | Admitting: Family Medicine

## 2022-07-20 DIAGNOSIS — O411230 Chorioamnionitis, third trimester, not applicable or unspecified: Secondary | ICD-10-CM | POA: Diagnosis not present

## 2022-07-20 DIAGNOSIS — O4210 Premature rupture of membranes, onset of labor more than 24 hours following rupture, unspecified weeks of gestation: Secondary | ICD-10-CM | POA: Diagnosis not present

## 2022-07-20 DIAGNOSIS — Z3A37 37 weeks gestation of pregnancy: Secondary | ICD-10-CM | POA: Diagnosis not present

## 2022-07-20 DIAGNOSIS — O4202 Full-term premature rupture of membranes, onset of labor within 24 hours of rupture: Secondary | ICD-10-CM | POA: Diagnosis not present

## 2022-07-20 MED ORDER — IBUPROFEN 600 MG PO TABS
600.0000 mg | ORAL_TABLET | Freq: Four times a day (QID) | ORAL | Status: DC
  Administered 2022-07-20 – 2022-07-22 (×9): 600 mg via ORAL
  Filled 2022-07-20 (×9): qty 1

## 2022-07-20 MED ORDER — PRENATAL MULTIVITAMIN CH
1.0000 | ORAL_TABLET | Freq: Every day | ORAL | Status: DC
  Administered 2022-07-20 – 2022-07-22 (×3): 1 via ORAL
  Filled 2022-07-20 (×3): qty 1

## 2022-07-20 MED ORDER — ZOLPIDEM TARTRATE 5 MG PO TABS: 5.0000 mg | ORAL_TABLET | Freq: Every evening | ORAL | Status: DC | PRN

## 2022-07-20 MED ORDER — SENNOSIDES-DOCUSATE SODIUM 8.6-50 MG PO TABS
2.0000 | ORAL_TABLET | ORAL | Status: DC
  Administered 2022-07-20 – 2022-07-22 (×3): 2 via ORAL
  Filled 2022-07-20 (×3): qty 2

## 2022-07-20 MED ORDER — DIBUCAINE (PERIANAL) 1 % EX OINT: 1.0000 | TOPICAL_OINTMENT | CUTANEOUS | Status: DC | PRN

## 2022-07-20 MED ORDER — DIPHENHYDRAMINE HCL 25 MG PO CAPS: 25.0000 mg | ORAL_CAPSULE | Freq: Four times a day (QID) | ORAL | Status: DC | PRN

## 2022-07-20 MED ORDER — MEASLES, MUMPS & RUBELLA VAC IJ SOLR
0.5000 mL | Freq: Once | INTRAMUSCULAR | Status: AC
  Administered 2022-07-21: 0.5 mL via SUBCUTANEOUS
  Filled 2022-07-20: qty 0.5

## 2022-07-20 MED ORDER — ONDANSETRON HCL 4 MG/2ML IJ SOLN: 4.0000 mg | INTRAMUSCULAR | Status: DC | PRN

## 2022-07-20 MED ORDER — BENZOCAINE-MENTHOL 20-0.5 % EX AERO
1.0000 | INHALATION_SPRAY | CUTANEOUS | Status: DC | PRN
  Administered 2022-07-20: 1 via TOPICAL
  Filled 2022-07-20: qty 56

## 2022-07-20 MED ORDER — WITCH HAZEL-GLYCERIN EX PADS: 1.0000 | MEDICATED_PAD | CUTANEOUS | Status: DC | PRN

## 2022-07-20 MED ORDER — COCONUT OIL OIL: 1.0000 | TOPICAL_OIL | Status: DC | PRN

## 2022-07-20 MED ORDER — SIMETHICONE 80 MG PO CHEW: 80.0000 mg | CHEWABLE_TABLET | ORAL | Status: DC | PRN

## 2022-07-20 MED ORDER — OXYCODONE HCL 5 MG PO TABS: 5.0000 mg | ORAL_TABLET | ORAL | Status: DC | PRN

## 2022-07-20 MED ORDER — MISOPROSTOL 200 MCG PO TABS
ORAL_TABLET | ORAL | Status: AC
  Filled 2022-07-20: qty 1

## 2022-07-20 MED ORDER — ONDANSETRON HCL 4 MG PO TABS: 4.0000 mg | ORAL_TABLET | ORAL | Status: DC | PRN

## 2022-07-20 MED ORDER — ACETAMINOPHEN 325 MG PO TABS
650.0000 mg | ORAL_TABLET | ORAL | Status: DC | PRN
  Administered 2022-07-20 – 2022-07-21 (×2): 650 mg via ORAL
  Filled 2022-07-20 (×2): qty 2

## 2022-07-20 MED ORDER — TETANUS-DIPHTH-ACELL PERTUSSIS 5-2.5-18.5 LF-MCG/0.5 IM SUSY: 0.5000 mL | PREFILLED_SYRINGE | Freq: Once | INTRAMUSCULAR | Status: DC

## 2022-07-20 NOTE — Lactation Note (Addendum)
This note was copied from a baby's chart. Lactation Consultation Note  Patient Name: Kelsey Cross [id number] Date: 07/20/2022 Reason for consult: Initial assessment;1st time breastfeeding;Early term 37-38.6wks Age:25 hours  P1, First time mother plans to breast and formula feed. Unwrapped baby for feeding.  Reviewed hand expression and gave baby drops on spoon.  Attempted latching in cross cradle and football hold and baby was sleepy.  Placed baby skin to skin on mother's chest.  Encouraged mother to hand express and give baby colostrum if he continues to not latch.  Feed on demand with cues.  Goal 8-12+ times per day after first 24 hrs.  Place baby STS if not cueing.  Suggest attempting at breast before offering formula.   Provided volume guidelines for breastfeeding with formula. Instructed mother to call RN or LC for help as needed. Lactation information sheet given.   Maternal Data Has patient been taught Hand Expression?: Yes Does the patient have breastfeeding experience prior to this delivery?: No  Feeding Mother's Current Feeding Choice: Breast Milk and Formula  Interventions Interventions: Breast feeding basics reviewed;Assisted with latch;Skin to skin  Consult Status Consult Status: Follow-up Date: 07/21/22 Follow-up type: In-patient   Carlye Grippe  RN [facility] 07/20/2022, 9:51 AM

## 2022-07-20 NOTE — Progress Notes (Signed)
Patient ID: Kelsey Cross, female   DOB: [birth year], 27 y.o.   MRN: [id number]  Reports rectal pressure; earlier Pit was adjusted down due to variables, which have mostly resolved  BP 132/81, P 100, T98.7 FHR 145-150s, +accels, early variables Ctx q 2-4 mins with Pit @6mu /min Cx C/C/vtx 0  IUP@38 .1wk ROM x 48h Triple I End 1st stage  She wants to try pushing even though the baby isn't low due to the strong urge; will push for a little while and see how it goes  Myrtis Ser CNM 07/20/2022 4:22 AM

## 2022-07-20 NOTE — Anesthesia Postprocedure Evaluation (Signed)
Anesthesia Post Note  Patient: Kelsey Cross  Procedure(s) Performed: AN AD [place]     Patient location during evaluation: Mother Baby Anesthesia Type: Epidural Level of consciousness: awake and alert and oriented Pain management: satisfactory to patient Vital Signs Assessment: post-procedure vital signs reviewed and stable Respiratory status: respiratory function stable Cardiovascular status: stable Postop Assessment: no headache, no backache, epidural receding, patient able to bend at knees, no signs of nausea or vomiting, adequate PO intake and able to ambulate Anesthetic complications: no   No notable events documented.  Last Vitals:  Vitals:   07/20/22 0810 07/20/22 1310  BP: 120/76 120/73  Pulse: 100 76  Resp: 16 16  Temp: 36.9 C 36.7 C  SpO2: 98% 99%    Last Pain:  Vitals:   07/20/22 1310  TempSrc: Oral  PainSc: 3    Pain Goal:                Epidural/Spinal Function Cutaneous sensation: Normal sensation (07/20/22 1310), Patient able to flex knees: Yes (07/20/22 1310), Patient able to lift hips off bed: Yes (07/20/22 1310), Back pain beyond tenderness at insertion site: No (07/20/22 1310), Progressively worsening motor and/or sensory loss: No (07/20/22 1310), Bowel and/or bladder incontinence post epidural: No (07/20/22 1310)  Dimple Bastyr

## 2022-07-20 NOTE — Discharge Summary (Signed)
Postpartum Discharge Summary  Date of Service updated***     Patient Name: Kelsey Cross DOB: 1996/05/19 MRN: [id number]  Date of admission: 07/18/2022 Delivery date:07/20/2022  Delivering provider: Serita Grammes D  Date of discharge: 07/20/2022  Admitting diagnosis: Premature rupture of membranes [O42.90] Intrauterine pregnancy: [redacted]w[redacted]d     Secondary diagnosis:  Principal Problem:   PROM (premature rupture of membranes) Active Problems:   Rubella non-immune status, antepartum   Intrauterine infection   Prolonged rupture of membranes, greater than 24 hours, delivered  Additional problems: none    Discharge diagnosis: Term Pregnancy Delivered                                              Post partum procedures: none Augmentation: Pitocin, Cytotec, and IP Foley Complications: Intrauterine Inflammation or infection (Chorioamniotis) and ROM>24 hours  Hospital course: Induction of Labor With Vaginal Delivery   27 y.o. yo G1P0 at [redacted]w[redacted]d was admitted to the hospital 07/18/2022 for induction of labor.  Indication for induction: PROM.  Patient had a labor course complicated by prolonged ROM x 50h. She had a dx of Triple I during labor for which she was given a dose of [place] and a course of Amp.  Membrane Rupture Time/Date: 3:00 AM ,07/18/2022   Delivery Method:Vaginal, Spontaneous  Episiotomy: None  Lacerations:  None  Details of delivery can be found in separate delivery note.  Patient had a postpartum course complicated by***. Patient is discharged home 07/20/22.  Newborn Data: Birth date:07/20/2022  Birth time:4:49 AM  Gender:Female  Living status:Living  Apgars:7 ,8  Weight:2760 g (6lb 1.4oz)  Magnesium Sulfate received: No BMZ received: No Rhophylac:N/A MMR:Yes*** T-DaP:Given prenatally Flu: No Transfusion:{Transfusion received:30440034}  Physical exam  Vitals:   07/20/22 0616 07/20/22 0631 07/20/22 0710 07/20/22 0810  BP: 129/79 126/82 130/84 120/76  Pulse: (!) 105 (!)  103 84 100  Resp:   16 16  Temp:    98.4 F (36.9 C)  TempSrc:    Oral  SpO2:    98%  Weight:      Height:       General: {Exam; general:21111117} Lochia: {Desc; appropriate/inappropriate:30686::"appropriate"} Uterine Fundus: {Desc; firm/soft:30687} Incision: {Exam; incision:21111123} DVT Evaluation: {Exam; dvt:2111122} Labs: Lab Results  Component Value Date   WBC 12.4 (H) 07/18/2022   HGB 12.2 07/18/2022   HCT 36.3 07/18/2022   MCV 86.6 07/18/2022   PLT 302 07/18/2022      Latest Ref Rng & Units 04/03/2016   12:41 PM  CMP  Glucose 65 - 99 mg/dL 81   BUN 6 - 20 mg/dL 10   Creatinine 0.57 - 1.00 mg/dL 0.45   Sodium 134 - 144 mmol/L 141   Potassium 3.5 - 5.2 mmol/L 3.9   Chloride 96 - 106 mmol/L 100   CO2 18 - 29 mmol/L 27   Calcium 8.7 - 10.2 mg/dL 8.9   Total Protein 6.0 - 8.5 g/dL 7.2   Total Bilirubin 0.0 - 1.2 mg/dL <0.2   Alkaline Phos 43 - 101 IU/L 91   AST 0 - 40 IU/L 15   ALT 0 - 32 IU/L 14    Edinburgh Score:     No data to display           After visit meds:  Allergies as of 07/20/2022   No Known Allergies   Med Rec must  be completed prior to using this [facility]***        Discharge home in stable condition Infant Feeding: {Baby feeding:23562} Infant Disposition:{CHL IP OB HOME WITH LDJTTS:17793} Discharge instruction: per After Visit Summary and Postpartum booklet. Activity: Advance as tolerated. Pelvic rest for 6 weeks.  Diet: routine diet Future Appointments: Future Appointments  Date Time Provider [place]  08/21/2022  2:10 PM Roma Schanz, CNM [facility]   Follow up Visit:  Myrtis Ser, CNM  Meredeth Ide R Please schedule this patient for Postpartum visit in: 6 weeks with the following provider: Any provider In-Person For C/S patients schedule nurse incision check in weeks 2 weeks: no Low risk pregnancy complicated by: prolonged rupture membranes Delivery mode:  SVD Anticipated Birth Control:  POPs PP  Procedures needed: none Schedule Integrated [place] visit: no  07/20/2022 Myrtis Ser, CNM

## 2022-07-20 NOTE — Plan of Care (Signed)
  Problem: Education: Goal: Knowledge of Childbirth will improve 07/20/2022 0656 by Thera Flake, RN Outcome: Completed/Met 07/19/2022 1936 by Thera Flake, RN Outcome: Progressing Goal: Ability to make informed decisions regarding treatment and plan of care will improve 07/20/2022 0656 by Thera Flake, RN Outcome: Completed/Met 07/19/2022 1936 by Thera Flake, RN Outcome: Progressing Goal: Ability to state and carry out methods to decrease the pain will improve 07/20/2022 0656 by Thera Flake, RN Outcome: Completed/Met 07/19/2022 1936 by Thera Flake, RN Outcome: Progressing Goal: Individualized Educational Video(s) 07/20/2022 0656 by Thera Flake, RN Outcome: Completed/Met 07/19/2022 1936 by Thera Flake, RN Outcome: Progressing   Problem: Coping: Goal: Ability to verbalize concerns and feelings about labor and delivery will improve 07/20/2022 0656 by Thera Flake, RN Outcome: Completed/Met 07/19/2022 1936 by Thera Flake, RN Outcome: Progressing   Problem: Life Cycle: Goal: Ability to make normal progression through stages of labor will improve 07/20/2022 0656 by Thera Flake, RN Outcome: Completed/Met 07/19/2022 1936 by Thera Flake, RN Outcome: Progressing Goal: Ability to effectively push during vaginal delivery will improve 07/20/2022 0656 by Thera Flake, RN Outcome: Completed/Met 07/19/2022 1936 by Thera Flake, RN Outcome: Progressing   Problem: Role Relationship: Goal: Will demonstrate positive interactions with the child 07/20/2022 0656 by Thera Flake, RN Outcome: Completed/Met 07/19/2022 1936 by Thera Flake, RN Outcome: Progressing   Problem: Safety: Goal: Risk of complications during labor and delivery will decrease 07/20/2022 0656 by Thera Flake, RN Outcome: Completed/Met 07/19/2022 1936 by Thera Flake, RN Outcome: Progressing   Problem: Pain Management: Goal: Relief or control of pain from uterine  contractions will improve 07/20/2022 0656 by Thera Flake, RN Outcome: Completed/Met 07/19/2022 1936 by Thera Flake, RN Outcome: Progressing   Problem: Skin Integrity: Goal: Demonstration of wound healing without infection will improve Outcome: Completed/Met

## 2022-07-21 ENCOUNTER — Encounter: Payer: Medicaid Other | Admitting: Medical

## 2022-07-21 ENCOUNTER — Encounter: Payer: Medicaid Other | Admitting: Advanced Practice Midwife

## 2022-07-21 NOTE — Lactation Note (Addendum)
This note was copied from a baby's chart. Lactation Consultation Note  Patient Name: Kelsey Cross [id number] Date: 07/21/2022 Reason for consult: Follow-up assessment;1st time breastfeeding;Early term 37-38.6wks Age:25 hours  P1, Baby opens and latches but lacks rhythmical sucking.  He had been supplemented with formula and has been using a pacifier.  Pacifier use not recommended at this time.  Mother states when baby latches, he becomes frustrated and may now prefer an artificial nipple. After a few attempts a #20 nipple shield was initiated. The NS was prefilled with formula.  Baby sustained latch with intermittent sucking bursts.  Encouraged mother to continue post pumping and supplementing with formula until he is able to sustain latch on breast for full feeding.  Encouraged mother to try latching without nipple shield half way through feeding.  Returned to room to assist with another feeding session. Attempted without nipple shield and baby started sustaining latch with longer periods of sucking bursts.  Encouraged mother to post pump and pace feed.   Maternal Data Has patient been taught Hand Expression?: Yes Does the patient have breastfeeding experience prior to this delivery?: No  Feeding Mother's Current Feeding Choice: Breast Milk and Formula  LATCH Score Latch: Repeated attempts needed to sustain latch, nipple held in mouth throughout feeding, stimulation needed to elicit sucking reflex.  Audible Swallowing: A few with stimulation  Type of Nipple: Everted at rest and after stimulation  Comfort (Breast/Nipple): Soft / non-tender  Hold (Positioning): Assistance needed to correctly position infant at breast and maintain latch.  LATCH Score: 7   Lactation Tools Discussed/Used Tools: Pump;Nipple Shields Nipple shield size: 20 Breast pump type: Double-Electric Breast Pump Reason for Pumping: stimulation and supplementation  Interventions Interventions: Assisted with  latch;Adjust position;Support pillows;Position options;Education;DEBP  Discharge Pump: DEBP  Consult Status Consult Status: Follow-up Date: 07/22/22 Follow-up type: In-patient    Vivianne [facility] 07/21/2022, 12:35 PM

## 2022-07-21 NOTE — Progress Notes (Signed)
Post Partum Day 1 Subjective: no complaints, up ad lib, voiding and tolerating PO, small lochia, plans to breastfeed, oral progesterone-only contraceptive  Objective: Blood pressure 123/81, pulse 62, temperature 98.1 F (36.7 C), resp. rate 17, height 5\' 3"  (1.6 m), weight 111.6 kg, last menstrual period 10/01/2021, SpO2 100 %, unknown if currently breastfeeding.  Physical Exam:  General: alert, cooperative and no distress Lochia:normal flow Chest: CTAB Heart: RRR no m/r/g Abdomen: +BS, soft, nontender,  Uterine Fundus: firm DVT Evaluation: No evidence of DVT seen on physical exam. Extremities: trace edema  Recent Labs    07/18/22 1555  HGB 12.2  HCT 36.3    Assessment/Plan: Plan for discharge tomorrow, Breastfeeding, Lactation consult, and Contraception POPs   LOS: 3 days   Christin Fudge 07/21/2022, 7:25 AM

## 2022-07-21 NOTE — Lactation Note (Signed)
This note was copied from a baby's chart. Lactation Consultation Note  Patient Name: Boy Julizza Sassone TDHRC'B Date: 07/21/2022 Reason for consult: Follow-up assessment;Difficult latch;Early term 37-38.6wks Age:25 hours  P1,  Assisted with latching with increase depth by shaping breast.  Baby latches and comes off and on breast.  Noted that with depth, he sustains breast. Encouraged mother to pump q 3 hours and supplement with mother's milk and formula.  Feeding Mother's Current Feeding Choice: Breast Milk and Formula  LATCH Score Latch: Repeated attempts needed to sustain latch, nipple held in mouth throughout feeding, stimulation needed to elicit sucking reflex.  Audible Swallowing: A few with stimulation  Type of Nipple: Everted at rest and after stimulation  Comfort (Breast/Nipple): Soft / non-tender  Hold (Positioning): Assistance needed to correctly position infant at breast and maintain latch.  LATCH Score: 7   Lactation Tools Discussed/Used  DEBP  Interventions Interventions: Breast feeding basics reviewed;Assisted with latch;Skin to skin;Hand express;Adjust position;Support pillows;Education  Discharge Pump: DEBP  Consult Status Consult Status: Follow-up Date: 07/22/22 Follow-up type: In-patient    Vivianne Master [facility] 07/21/2022, 4:50 PM

## 2022-07-22 MED ORDER — SLYND 4 MG PO TABS: 1.0000 | ORAL_TABLET | Freq: Every day | ORAL | 0 refills | Status: DC

## 2022-07-22 MED ORDER — IBUPROFEN 600 MG PO TABS: 600.0000 mg | ORAL_TABLET | Freq: Four times a day (QID) | ORAL | 0 refills | Status: DC

## 2022-07-22 MED ORDER — ACETAMINOPHEN 325 MG PO TABS: 650.0000 mg | ORAL_TABLET | ORAL | 0 refills | Status: DC | PRN

## 2022-07-24 LAB — SURGICAL PATHOLOGY

## 2022-07-28 ENCOUNTER — Encounter: Payer: Medicaid Other | Admitting: Obstetrics and Gynecology

## 2022-07-31 ENCOUNTER — Telehealth (HOSPITAL_COMMUNITY): Payer: Self-pay | Admitting: *Deleted

## 2022-07-31 NOTE — Telephone Encounter (Signed)
Attempted hospital discharge follow-up call. Left message for patient to return RN call with any questions or concerns. Erline Levine, RN, 07/31/22, [phone]

## 2022-08-21 ENCOUNTER — Ambulatory Visit: Payer: Medicaid Other | Admitting: Women's Health

## 2022-08-24 ENCOUNTER — Ambulatory Visit: Payer: Medicaid Other | Admitting: Advanced Practice Midwife

## 2022-08-25 ENCOUNTER — Ambulatory Visit (INDEPENDENT_AMBULATORY_CARE_PROVIDER_SITE_OTHER): Payer: Medicaid Other | Admitting: Advanced Practice Midwife

## 2022-08-25 ENCOUNTER — Encounter: Payer: Self-pay | Admitting: Advanced Practice Midwife

## 2022-08-25 MED ORDER — NORETHINDRONE 0.35 MG PO TABS: 1.0000 | ORAL_TABLET | Freq: Every day | ORAL | 11 refills | Status: AC

## 2022-08-25 NOTE — Progress Notes (Signed)
POSTPARTUM VISIT Patient name: Kelsey Cross MRN [id number]  Date of birth: [birth year] Chief Complaint:   Postpartum Care (Talk birth control)  History of Present Illness:   Kelsey Cross is a 27 y.o. G29P1001 Hispanic female being seen today for a postpartum visit. She is 5 weeks postpartum following a spontaneous vaginal delivery at 38.1 gestational weeks. IOL: yes, for PROM. Anesthesia: epidural.  Laceration: none.  Complications: prolonged ROM x 50h, Triple I. Inpatient contraception: no.   Pregnancy complicated by rubella nonimmune, obesity . Tobacco use: no. Substance use disorder: no. Last pap smear: Aug 2023 and results were ASCUS w/ HRHPV negative. Next pap smear due: Aug 2026 Patient's last menstrual period was 08/24/2022.  Postpartum course has been uncomplicated. Bleeding  just started last night; prob menses . Bowel function is normal. Bladder function is normal. Urinary incontinence? no, fecal incontinence? no Patient is sexually active. Last sexual activity:  3d ago . Desired contraception: POPs. Patient does want a pregnancy in the future.  Desired family size is unsure number of children.   Upstream - 08/25/22 1120       Pregnancy Intention Screening   Does the patient want to become pregnant in the next year? No    Does the patient's partner want to become pregnant in the next year? No    Would the patient like to discuss contraceptive options today? Yes      Contraception Wrap Up   Current Method --   breast and bottle feeding   End Method --   breast and bottle feeding   Contraception Counseling Provided Yes            The pregnancy intention screening data noted above was reviewed. Potential methods of contraception were discussed. The patient elected to proceed with -- (breast and bottle feeding).  Edinburgh Postpartum Depression Screening: negative  Edinburgh Postnatal Depression Scale - 08/25/22 1123       Edinburgh Postnatal Depression  Scale:  In the Past 7 Days   I have been able to laugh and see the funny side of things. 0    I have looked forward with enjoyment to things. 0    I have blamed myself unnecessarily when things went wrong. 0    I have been anxious or worried for no good reason. 0    I have felt scared or panicky for no good reason. 0    Things have been getting on top of me. 1    I have been so unhappy that I have had difficulty sleeping. 0    I have felt sad or miserable. 0    I have been so unhappy that I have been crying. 0    The thought of harming myself has occurred to me. 0    Edinburgh Postnatal Depression Scale Total 1                01/27/2022   11:44 AM  GAD 7 : Generalized Anxiety Score  Nervous, Anxious, on Edge 1  Control/stop worrying 0  Worry too much - different things 1  Trouble relaxing 0  Restless 0  Easily annoyed or irritable 1  Afraid - awful might happen 1  Total GAD 7 Score 4     Baby's course has been uncomplicated. Baby is feeding by breast and bottle: milk supply adequate. Infant has a pediatrician/family doctor? Yes.  Childcare strategy if returning to work/school: n/a-stay at home mom.  Pt has material needs met for  her and baby: Yes.   Review of Systems:   Pertinent items are noted in HPI Denies Abnormal vaginal discharge w/ itching/odor/irritation, headaches, visual changes, shortness of breath, chest pain, abdominal pain, severe nausea/vomiting, or problems with urination or bowel movements. Pertinent History Reviewed:  Reviewed past medical,surgical, obstetrical and family history.  Reviewed problem list, medications and allergies. OB History  Gravida Para Term Preterm AB Living  '1 1 1     1  '$ SAB IAB Ectopic Multiple Live Births        0 1    # Outcome Date GA Lbr Len/2nd Weight Sex Delivery Anes PTL Lv  1 Term 07/20/22 [id number]:02 / 00:47 6 lb 1.4 oz (2.76 kg) M Vag-Spont EPI  LIV   Physical Assessment:   Vitals:   08/25/22 1115  BP: 118/78   Pulse: 97  Weight: 235 lb (106.6 kg)  Height: 5' 3.2" (1.605 m)  Body mass index is 41.37 kg/m.       Physical Examination:   General appearance: alert, well appearing, and in no distress  Mental status: alert, oriented to person, place, and time  Skin: warm & dry   Cardiovascular: normal heart rate noted   Respiratory: normal respiratory effort, no distress   Breasts: deferred, no complaints   Abdomen: soft, non-tender   Pelvic: normal external genitalia, vulva, vagina, cervix, uterus and adnexa. Thin prep pap obtained: No  Rectal: not examined  Extremities: Edema: none         No results found for this or any previous visit (from the past 24 hour(s)).  Assessment & Plan:  1) Postpartum exam 2) Five wks s/p spontaneous vaginal delivery 3) breast & bottle feeding 4) Depression screening 5) Contraception management: rx Micronor; no sex x 10d then take preg test prior to starting POPs; use backup x first month  Essential components of care per ACOG recommendations:  1.  Mood and well being:  If positive depression screen, discussed and plan developed.  If using tobacco we discussed reduction/cessation and risk of relapse If current substance abuse, we discussed and referral to local resources was offered.   2. Infant care and feeding:  If breastfeeding, discussed returning to work, pumping, breastfeeding-associated pain, guidance regarding return to fertility while lactating if not using another method. If needed, patient was provided with a letter to be allowed to pump q 2-3hrs to support lactation in a private location with access to a refrigerator to store breastmilk.   Recommended that all caregivers be immunized for flu, pertussis and other preventable communicable diseases If pt does not have material needs met for her/baby, referred to local resources for help obtaining these.  3. Sexuality, contraception and birth spacing Provided guidance regarding sexuality,  management of dyspareunia, and resumption of intercourse Discussed avoiding interpregnancy interval <685ms and recommended birth spacing of 18 months  4. Sleep and fatigue Discussed coping options for fatigue and sleep disruption Encouraged family/partner/community support of 4 hrs of uninterrupted sleep to help with mood and fatigue  5. Physical recovery  If pt had a C/S, assessed incisional pain and providing guidance on normal vs prolonged recovery If pt had a laceration, perineal healing and pain reviewed.  If urinary or fecal incontinence, discussed management and referred to PT or uro/gyn if indicated  Patient is safe to resume physical activity. Discussed attainment of healthy weight.  6.  Chronic disease management Discussed pregnancy complications if any, and their implications for future childbearing and long-term maternal health. Review recommendations  for prevention of recurrent pregnancy complications, such as 17 hydroxyprogesterone caproate to reduce risk for recurrent PTB not applicable, or aspirin to reduce risk of preeclampsia not applicable. Pt had GDM: no. If yes, 2hr GTT scheduled: not applicable. Reviewed medications and non-pregnant dosing including consideration of whether pt is breastfeeding using a reliable resource such as LactMed: yes Referred for f/u w/ PCP or subspecialist providers as indicated: not applicable  7. Health maintenance Mammogram at 25yo or earlier if indicated Pap smears as indicated  Meds:  Meds ordered this encounter  Medications   norethindrone (MICRONOR) 0.35 MG tablet    Sig: Take 1 tablet (0.35 mg total) by mouth daily.    Dispense:  28 tablet    Refill:  11    Order Specific Question:   Supervising Provider    Answer:   Janyth Pupa [id number]    Follow-up: Return for 3 month f/u contraception.   No orders of the defined types were placed in this encounter.   Myrtis Ser CNM 08/25/2022 11:40 AM

## 2022-08-25 NOTE — Patient Instructions (Signed)
The website www.postpartum.net has good postpartum resources!  Also, the LactMed website can guide you for medications safe for you with breastfeeding.

## 2022-11-06 ENCOUNTER — Encounter: Payer: Self-pay | Admitting: Women's Health

## 2022-11-06 ENCOUNTER — Encounter: Payer: Medicaid Other | Admitting: Women's Health

## 2022-11-07 NOTE — Progress Notes (Signed)
This encounter was created in error - please disregard.  Was scheduled for f/u on POPs, but stopped taking and using condoms, didn't have any other concerns. Not seen by provider. Appt cancelled.
